# Patient Record
Sex: Female | Born: 1960 | Hispanic: Yes | Smoking: Never smoker
Health system: Southern US, Community
[De-identification: ages and names within clinical notes are randomized; demographics above are authoritative.]

## PROBLEM LIST (undated history)

## (undated) DIAGNOSIS — E785 Hyperlipidemia, unspecified: Secondary | ICD-10-CM

## (undated) DIAGNOSIS — K7581 Nonalcoholic steatohepatitis (NASH): Secondary | ICD-10-CM

## (undated) DIAGNOSIS — R001 Bradycardia, unspecified: Secondary | ICD-10-CM

## (undated) DIAGNOSIS — E039 Hypothyroidism, unspecified: Secondary | ICD-10-CM

## (undated) HISTORY — PX: CHOLECYSTECTOMY: SHX55

---

## 2013-07-08 DIAGNOSIS — K819 Cholecystitis, unspecified: Secondary | ICD-10-CM | POA: Insufficient documentation

## 2015-03-07 ENCOUNTER — Ambulatory Visit: Admit: 2015-03-07 | Disposition: A | Discharge status: Home / Self Care | Payer: Self-pay | Admitting: Internal Medicine

## 2015-05-16 ENCOUNTER — Encounter: Payer: Self-pay | Admitting: *Deleted

## 2015-05-17 ENCOUNTER — Ambulatory Visit: Payer: BLUE CROSS/BLUE SHIELD | Admitting: Anesthesiology

## 2015-05-17 ENCOUNTER — Ambulatory Visit
Admission: RE | Admit: 2015-05-17 | Discharge: 2015-05-17 | Disposition: A | Discharge status: Home / Self Care | Payer: BLUE CROSS/BLUE SHIELD | Source: Ambulatory Visit | Attending: Unknown Physician Specialty | Admitting: Unknown Physician Specialty

## 2015-05-17 ENCOUNTER — Encounter
Admission: RE | Disposition: A | Discharge status: Home / Self Care | Payer: Self-pay | Source: Ambulatory Visit | Attending: Unknown Physician Specialty

## 2015-05-17 ENCOUNTER — Encounter: Payer: Self-pay | Admitting: *Deleted

## 2015-05-17 DIAGNOSIS — E785 Hyperlipidemia, unspecified: Secondary | ICD-10-CM | POA: Insufficient documentation

## 2015-05-17 DIAGNOSIS — K621 Rectal polyp: Secondary | ICD-10-CM | POA: Insufficient documentation

## 2015-05-17 DIAGNOSIS — D123 Benign neoplasm of transverse colon: Secondary | ICD-10-CM | POA: Diagnosis not present

## 2015-05-17 DIAGNOSIS — Z1211 Encounter for screening for malignant neoplasm of colon: Secondary | ICD-10-CM | POA: Diagnosis present

## 2015-05-17 DIAGNOSIS — K648 Other hemorrhoids: Secondary | ICD-10-CM | POA: Diagnosis not present

## 2015-05-17 HISTORY — DX: Hyperlipidemia, unspecified: E78.5

## 2015-05-17 HISTORY — PX: COLONOSCOPY WITH PROPOFOL: SHX5780

## 2015-05-17 LAB — POCT PREGNANCY, URINE: Preg Test, Ur: NEGATIVE

## 2015-05-17 SURGERY — COLONOSCOPY WITH PROPOFOL
Anesthesia: General

## 2015-05-17 MED ORDER — SODIUM CHLORIDE 0.9 % IV SOLN: INTRAVENOUS | Status: DC

## 2015-05-17 MED ORDER — SODIUM CHLORIDE 0.9 % IV SOLN
INTRAVENOUS | Status: DC | PRN
  Administered 2015-05-17: 13:00:00 via INTRAVENOUS

## 2015-05-17 MED ORDER — PROPOFOL INFUSION 10 MG/ML OPTIME
INTRAVENOUS | Status: DC | PRN
  Administered 2015-05-17: 120 ug/kg/min via INTRAVENOUS

## 2015-05-17 MED ORDER — PROPOFOL 10 MG/ML IV BOLUS
INTRAVENOUS | Status: DC | PRN
  Administered 2015-05-17: 100 mg via INTRAVENOUS

## 2015-05-17 MED ORDER — EPHEDRINE SULFATE 50 MG/ML IJ SOLN
INTRAMUSCULAR | Status: DC | PRN
  Administered 2015-05-17 (×2): 10 mg via INTRAVENOUS

## 2015-05-17 NOTE — Anesthesia Postprocedure Evaluation (Signed)
  Anesthesia Post-op Note  Patient: Madison Harvey  Procedure(s) Performed: Procedure(s): COLONOSCOPY WITH PROPOFOL (N/A)  Anesthesia type:General  Patient location: PACU  Post pain: Pain level controlled  Post assessment: Post-op Vital signs reviewed, Patient's Cardiovascular Status Stable, Respiratory Function Stable, Patent Airway and No signs of Nausea or vomiting  Post vital signs: Reviewed and stable  Last Vitals:  Filed Vitals:   05/17/15 1400  BP: 110/56  Pulse: 93  Temp:   Resp: 21    Level of consciousness: awake, alert  and patient cooperative  Complications: No apparent anesthesia complications

## 2015-05-17 NOTE — Progress Notes (Signed)
Negative preg urine test.

## 2015-05-17 NOTE — Op Note (Signed)
[facility] Patient Name: Madison Harvey Procedure Date: 05/17/2015 1:25 PM MRN: [id number] Account #: [id number] Date of Birth: 1961-02-03 Admit Type: Outpatient Age: 54 Room: [facility] ENDO ROOM 1 Gender: Female Note Status: Finalized Procedure:         Colonoscopy Indications:       Screening for colorectal malignant neoplasm Providers:         Manya Silvas, MD Referring MD:      Glendon Axe (Referring MD) Medicines:         Propofol per Anesthesia Complications:     No immediate complications. Procedure:         Pre-Anesthesia Assessment:                    - After reviewing the risks and benefits, the patient was                     deemed in satisfactory condition to undergo the procedure.                    After obtaining informed consent, the colonoscope was                     passed under direct vision. Throughout the procedure, the                     patient's blood pressure, pulse, and oxygen saturations                     were monitored continuously. The Colonoscope was                     introduced through the anus and advanced to the the cecum,                     identified by appendiceal orifice and ileocecal valve. The                     colonoscopy was performed without difficulty. The patient                     tolerated the procedure well. The quality of the bowel                     preparation was good. Findings:      Two sessile polyps were found in the rectum and at the hepatic flexure.       The polyps were diminutive in size. These polyps were removed with a       jumbo cold forceps. Resection and retrieval were complete.      The exam was otherwise without abnormality. Impression:        - Two diminutive polyps in the rectum and at the hepatic                     flexure. Resected and retrieved.                    - The examination was otherwise normal. Recommendation:    - Await pathology results. Manya Silvas, MD 05/17/2015 1:55:28 PM This report has been signed electronically. Number of Addenda: 0 Note Initiated On: 05/17/2015 1:25 PM Scope Withdrawal Time: 0 hours 15 minutes 15 seconds  Total Procedure Duration: 0 hours 26 minutes 1 second  [facility]

## 2015-05-17 NOTE — Anesthesia Preprocedure Evaluation (Signed)
Anesthesia Evaluation  Patient identified by MRN, date of birth, ID band Patient awake    Reviewed: Allergy & Precautions, NPO status , Patient's Chart, lab work & pertinent test results  Airway Mallampati: I  TM Distance: >3 FB Neck ROM: Full    Dental  (+) Teeth Intact   Pulmonary    Pulmonary exam normal       Cardiovascular Exercise Tolerance: Good negative cardio ROS Normal cardiovascular exam    Neuro/Psych    GI/Hepatic   Endo/Other    Renal/GU      Musculoskeletal   Abdominal Normal abdominal exam  (+)   Peds  Hematology   Anesthesia Other Findings   Reproductive/Obstetrics                             Anesthesia Physical Anesthesia Plan  ASA: I  Anesthesia Plan: General   Post-op Pain Management:    Induction: Intravenous  Airway Management Planned: Simple Face Mask  Additional Equipment:   Intra-op Plan:   Post-operative Plan:   Informed Consent: I have reviewed the patients History and Physical, chart, labs and discussed the procedure including the risks, benefits and alternatives for the proposed anesthesia with the patient or authorized representative who has indicated his/her understanding and acceptance.     Plan Discussed with: CRNA  Anesthesia Plan Comments:         Anesthesia Quick Evaluation

## 2015-05-17 NOTE — H&P (Signed)
Primary Care Physician:  No primary care provider on file. Primary Gastroenterologist:  Dr. Vira Agar  Pre-Procedure History & Physical: HPI:  Madison Harvey is a 54 y.o. female is here for an colonoscopy.   Past Medical History  Diagnosis Date  . Hyperlipidemia     Past Surgical History  Procedure Laterality Date  . Cholecystectomy      Prior to Admission medications   Not on File    Allergies as of 04/12/2015  . (Not on File)    History reviewed. No pertinent family history.  History   Social History  . Marital Status: Divorced    Spouse Name: N/A  . Number of Children: N/A  . Years of Education: N/A   Occupational History  . Not on file.   Social History Main Topics  . Smoking status: Not on file  . Smokeless tobacco: Not on file  . Alcohol Use: Not on file  . Drug Use: Not on file  . Sexual Activity: Not on file   Other Topics Concern  . Not on file   Social History Narrative    Review of Systems: See HPI, otherwise negative ROS  Physical Exam: BP 122/64 mmHg  Pulse 62  Temp(Src) 98 F (36.7 C) (Tympanic)  Resp 20  Ht 5\' 2"  (1.575 m)  Wt 61.689 kg (136 lb)  BMI 24.87 kg/m2  SpO2 100% General:   Alert,  pleasant and cooperative in NAD Head:  Normocephalic and atraumatic. Neck:  Supple; no masses or thyromegaly. Lungs:  Clear throughout to auscultation.    Heart:  Regular rate and rhythm. Abdomen:  Soft, nontender and nondistended. Normal bowel sounds, without guarding, and without rebound.   Neurologic:  Alert and  oriented x4;  grossly normal neurologically.  Impression/Plan: Murlene Revell is here for an colonoscopy to be performed for screening.  Risks, benefits, limitations, and alternatives regarding  colonoscopy have been reviewed with the patient.  Questions have been answered.  All parties agreeable.   Gaylyn Cheers, MD  05/17/2015, 1:16 PM   Primary Care Physician:  No primary care provider on file. Primary  Gastroenterologist:  Dr. Vira Agar  Pre-Procedure History & Physical: HPI:  Madison Harvey is a 54 y.o. female is here for an colonoscopy.   Past Medical History  Diagnosis Date  . Hyperlipidemia     Past Surgical History  Procedure Laterality Date  . Cholecystectomy      Prior to Admission medications   Not on File    Allergies as of 04/12/2015  . (Not on File)    History reviewed. No pertinent family history.  History   Social History  . Marital Status: Divorced    Spouse Name: N/A  . Number of Children: N/A  . Years of Education: N/A   Occupational History  . Not on file.   Social History Main Topics  . Smoking status: Not on file  . Smokeless tobacco: Not on file  . Alcohol Use: Not on file  . Drug Use: Not on file  . Sexual Activity: Not on file   Other Topics Concern  . Not on file   Social History Narrative    Review of Systems: See HPI, otherwise negative ROS  Physical Exam: BP 122/64 mmHg  Pulse 62  Temp(Src) 98 F (36.7 C) (Tympanic)  Resp 20  Ht 5\' 2"  (1.575 m)  Wt 61.689 kg (136 lb)  BMI 24.87 kg/m2  SpO2 100% General:   Alert,  pleasant and cooperative in NAD Head:  Normocephalic and atraumatic. Neck:  Supple; no masses or thyromegaly. Lungs:  Clear throughout to auscultation.    Heart:  Regular rate and rhythm. Abdomen:  Soft, nontender and nondistended. Normal bowel sounds, without guarding, and without rebound.   Neurologic:  Alert and  oriented x4;  grossly normal neurologically.  Impression/Plan: Madison Harvey is here for an colonoscopy to be performed for screening  Risks, benefits, limitations, and alternatives regarding  colonoscopy have been reviewed with the patient.  Questions have been answered.  All parties agreeable.   Gaylyn Cheers, MD  05/17/2015, 1:16 PM

## 2015-05-17 NOTE — Transfer of Care (Signed)
Immediate Anesthesia Transfer of Care Note  Patient: Madison Harvey  Procedure(s) Performed: Procedure(s): COLONOSCOPY WITH PROPOFOL (N/A)  Patient Location: PACU  Anesthesia Type:General  Level of Consciousness: awake  Airway & Oxygen Therapy: Patient Spontanous Breathing  Post-op Assessment: Report given to RN  Post vital signs: stable  Last Vitals:  Filed Vitals:   05/17/15 1301  BP: 122/64  Pulse: 62  Temp: 36.7 C  Resp: 20    Complications: No apparent anesthesia complications

## 2015-05-20 LAB — SURGICAL PATHOLOGY

## 2015-05-27 ENCOUNTER — Encounter: Payer: Self-pay | Admitting: Unknown Physician Specialty

## 2016-02-28 ENCOUNTER — Other Ambulatory Visit: Payer: Self-pay | Admitting: Internal Medicine

## 2016-02-28 DIAGNOSIS — E7800 Pure hypercholesterolemia, unspecified: Secondary | ICD-10-CM | POA: Insufficient documentation

## 2016-02-28 DIAGNOSIS — Z1239 Encounter for other screening for malignant neoplasm of breast: Secondary | ICD-10-CM

## 2016-03-10 ENCOUNTER — Ambulatory Visit
Admission: RE | Admit: 2016-03-10 | Discharge: 2016-03-10 | Disposition: A | Discharge status: Home / Self Care | Payer: BLUE CROSS/BLUE SHIELD | Source: Ambulatory Visit | Attending: Internal Medicine | Admitting: Internal Medicine

## 2016-03-10 DIAGNOSIS — Z1231 Encounter for screening mammogram for malignant neoplasm of breast: Secondary | ICD-10-CM | POA: Diagnosis not present

## 2016-03-10 DIAGNOSIS — Z1239 Encounter for other screening for malignant neoplasm of breast: Secondary | ICD-10-CM

## 2016-11-25 ENCOUNTER — Ambulatory Visit
Admission: EM | Admit: 2016-11-25 | Discharge: 2016-11-25 | Disposition: A | Discharge status: Home / Self Care | Payer: BLUE CROSS/BLUE SHIELD | Attending: Internal Medicine | Admitting: Internal Medicine

## 2016-11-25 ENCOUNTER — Encounter: Payer: Self-pay | Admitting: Emergency Medicine

## 2016-11-25 DIAGNOSIS — K2900 Acute gastritis without bleeding: Secondary | ICD-10-CM

## 2016-11-25 DIAGNOSIS — R1013 Epigastric pain: Secondary | ICD-10-CM | POA: Diagnosis not present

## 2016-11-25 DIAGNOSIS — R740 Nonspecific elevation of levels of transaminase and lactic acid dehydrogenase [LDH]: Secondary | ICD-10-CM | POA: Diagnosis not present

## 2016-11-25 DIAGNOSIS — R7401 Elevation of levels of liver transaminase levels: Secondary | ICD-10-CM

## 2016-11-25 LAB — URINALYSIS, COMPLETE (UACMP) WITH MICROSCOPIC
Bilirubin Urine: NEGATIVE
Glucose, UA: NEGATIVE mg/dL
Ketones, ur: NEGATIVE mg/dL
Nitrite: NEGATIVE
Protein, ur: NEGATIVE mg/dL
Specific Gravity, Urine: <1.005 — ABNORMAL LOW (ref 1.005–1.030)
pH: 5.5 (ref 5.0–8.0)

## 2016-11-25 LAB — CBC WITH DIFFERENTIAL/PLATELET
Basophils Absolute: 0 10*3/uL (ref 0–0.1)
Basophils Relative: 1 %
Eosinophils Absolute: 0.1 10*3/uL (ref 0–0.7)
Eosinophils Relative: 2 %
HCT: 38.4 % (ref 35.0–47.0)
Hemoglobin: 12.9 g/dL (ref 12.0–16.0)
Lymphocytes Relative: 28 %
Lymphs Abs: 1.9 10*3/uL (ref 1.0–3.6)
MCH: 29.1 pg (ref 26.0–34.0)
MCHC: 33.6 g/dL (ref 32.0–36.0)
MCV: 86.4 fL (ref 80.0–100.0)
Monocytes Absolute: 0.8 10*3/uL (ref 0.2–0.9)
Monocytes Relative: 11 %
Neutro Abs: 4.1 10*3/uL (ref 1.4–6.5)
Neutrophils Relative %: 58 %
Platelets: 260 10*3/uL (ref 150–440)
RBC: 4.44 MIL/uL (ref 3.80–5.20)
RDW: 12.8 % (ref 11.5–14.5)
WBC: 6.9 10*3/uL (ref 3.6–11.0)

## 2016-11-25 LAB — COMPREHENSIVE METABOLIC PANEL
ALT: 96 U/L — ABNORMAL HIGH (ref 14–54)
AST: 191 U/L — ABNORMAL HIGH (ref 15–41)
Albumin: 3.7 g/dL (ref 3.5–5.0)
Alkaline Phosphatase: 133 U/L — ABNORMAL HIGH (ref 38–126)
Anion gap: 9 (ref 5–15)
BUN: 11 mg/dL (ref 6–20)
CO2: 22 mmol/L (ref 22–32)
Calcium: 8.5 mg/dL — ABNORMAL LOW (ref 8.9–10.3)
Chloride: 103 mmol/L (ref 101–111)
Creatinine, Ser: 0.49 mg/dL (ref 0.44–1.00)
GFR calc Af Amer: >60 mL/min (ref >60)
GFR calc non Af Amer: >60 mL/min (ref >60)
Glucose, Bld: 101 mg/dL — ABNORMAL HIGH (ref 65–99)
Potassium: 3.3 mmol/L — ABNORMAL LOW (ref 3.5–5.1)
Sodium: 134 mmol/L — ABNORMAL LOW (ref 135–145)
Total Bilirubin: 0.3 mg/dL (ref 0.3–1.2)
Total Protein: 7.3 g/dL (ref 6.5–8.1)

## 2016-11-25 LAB — LIPASE, BLOOD: Lipase: 33 U/L (ref 11–51)

## 2016-11-25 LAB — AMYLASE: Amylase: 64 U/L (ref 28–100)

## 2016-11-25 MED ORDER — ONDANSETRON HCL 4 MG PO TABS
8.0000 mg | ORAL_TABLET | ORAL | 0 refills | Status: DC | PRN
Start: 2016-11-25 — End: 2018-12-21

## 2016-11-25 MED ORDER — OMEPRAZOLE 40 MG PO CPDR: 40.0000 mg | DELAYED_RELEASE_CAPSULE | Freq: Two times a day (BID) | ORAL | 0 refills | Status: DC

## 2016-11-25 NOTE — ED Triage Notes (Signed)
Patient stated she has been having abdominal pains, vomiting, no appetite and dizziness for 1 week

## 2016-11-25 NOTE — Discharge Instructions (Addendum)
Prescription for omeprazole (for stomach acid) and ondansetron (for nausea/vomiting) sent to the pharmacy.  Followup with primary care provider to discuss further evaluation/management of elevated liver tests.  Avoid alcohol as it will aggravate the elevated liver tests and also the stomach discomfort.

## 2016-11-25 NOTE — ED Provider Notes (Signed)
[facility]    CSN: [id number] Arrival date & time: 11/25/16  1518     History   Chief Complaint Chief Complaint  Patient presents with  . Abdominal Pain    HPI Madison Harvey is a 55 y.o. female. She presents today with history of vomiting multiple times on Monday and Tuesday this week, this has subsided but she doesn't have any appetite yet. She had some diarrhea yesterday. No fever, no cough. No sore throat. She does have residual epigastric discomfort and bloating. She relates that the epigastric discomfort and bloating actually have been going on for several days, started last week. She has question of past history of lupus. She is worried about a liver disorder. She has had her gallbladder out in the last several years.  She has a rash on her hands intermittently for the last 7 years, itchy.   HPI  Past Medical History:  Diagnosis Date  . Hyperlipidemia      Past Surgical History:  Procedure Laterality Date  . CHOLECYSTECTOMY    . COLONOSCOPY WITH PROPOFOL N/A 05/17/2015   Procedure: COLONOSCOPY WITH PROPOFOL;  Surgeon: Manya Silvas, MD;  Location: [facility] ENDOSCOPY;  Service: Endoscopy;  Laterality: N/A;     Home Medications   Takes no meds regularly  Family History Family History  Problem Relation Age of Onset  . Breast cancer Neg Hx     Social History Social History  Substance Use Topics  . Smoking status: Never Smoker  . Smokeless tobacco: Never Used  . Alcohol use No     Allergies   Patient has no known allergies.   Review of Systems Review of Systems  All other systems reviewed and are negative.    Physical Exam Triage Vital Signs ED Triage Vitals  Enc Vitals Group     BP 11/25/16 1617 108/63     Pulse Rate 11/25/16 1617 62     Resp 11/25/16 1617 16     Temp 11/25/16 1617 97.4 F (36.3 C)     Temp src --      SpO2 11/25/16 1617 99 %     Weight 11/25/16 1619 135 lb (61.2 kg)     Height 11/25/16 1619 5\' 1"  (1.549 m)       Pain Score 11/25/16 1621 2   No data found.   Updated Vital Signs BP 108/63 (BP Location: Left Arm)   Pulse 62   Temp 97.4 F (36.3 C)   Resp 16   Ht 5\' 1"  (1.549 m)   Wt 135 lb (61.2 kg)   SpO2 99%   BMI 25.51 kg/m  Physical Exam  Constitutional: She is oriented to person, place, and time. No distress.  Alert, nicely groomed  HENT:  Head: Atraumatic.  Eyes:  Conjugate gaze, no eye redness/drainage  Neck: Neck supple.  Cardiovascular: Normal rate and regular rhythm.   Pulmonary/Chest: No respiratory distress. She has no wheezes. She has no rales.  Lungs clear, symmetric breath sounds  Abdominal: Soft. She exhibits no distension. There is no tenderness. There is no rebound and no guarding.  Musculoskeletal: Normal range of motion.  No leg swelling  Neurological: She is alert and oriented to person, place, and time.  Skin: Skin is warm and dry.  No cyanosis Little patch of excoriated red papules over the base of the right thumb dorsally.  Nursing note and vitals reviewed.    UC Treatments / Results  Labs Results for orders placed or performed during the  hospital encounter of 11/25/16  CBC with Differential  Result Value Ref Range   WBC 6.9 3.6 - 11.0 K/uL   RBC 4.44 3.80 - 5.20 MIL/uL   Hemoglobin 12.9 12.0 - 16.0 g/dL   HCT 38.4 35.0 - 47.0 %   MCV 86.4 80.0 - 100.0 fL   MCH 29.1 26.0 - 34.0 pg   MCHC 33.6 32.0 - 36.0 g/dL   RDW 12.8 11.5 - 14.5 %   Platelets 260 150 - 440 K/uL   Neutrophils Relative % 58 %   Neutro Abs 4.1 1.4 - 6.5 K/uL   Lymphocytes Relative 28 %   Lymphs Abs 1.9 1.0 - 3.6 K/uL   Monocytes Relative 11 %   Monocytes Absolute 0.8 0.2 - 0.9 K/uL   Eosinophils Relative 2 %   Eosinophils Absolute 0.1 0 - 0.7 K/uL   Basophils Relative 1 %   Basophils Absolute 0.0 0 - 0.1 K/uL  Lipase, blood  Result Value Ref Range   Lipase 33 11 - 51 U/L  Amylase  Result Value Ref Range   Amylase 64 28 - 100 U/L  Comprehensive metabolic panel   Result Value Ref Range   Sodium 134 (L) 135 - 145 mmol/L   Potassium 3.3 (L) 3.5 - 5.1 mmol/L   Chloride 103 101 - 111 mmol/L   CO2 22 22 - 32 mmol/L   Glucose, Bld 101 (H) 65 - 99 mg/dL   BUN 11 6 - 20 mg/dL   Creatinine, Ser 0.49 0.44 - 1.00 mg/dL   Calcium 8.5 (L) 8.9 - 10.3 mg/dL   Total Protein 7.3 6.5 - 8.1 g/dL   Albumin 3.7 3.5 - 5.0 g/dL   AST 191 (H) 15 - 41 U/L   ALT 96 (H) 14 - 54 U/L   Alkaline Phosphatase 133 (H) 38 - 126 U/L   Total Bilirubin 0.3 0.3 - 1.2 mg/dL   GFR calc non Af Amer >60 >60 mL/min   GFR calc Af Amer >60 >60 mL/min   Anion gap 9 5 - 15  Urinalysis, Complete w Microscopic  Result Value Ref Range   Color, Urine YELLOW YELLOW   APPearance CLEAR CLEAR   Specific Gravity, Urine <1.005 (L) 1.005 - 1.030   pH 5.5 5.0 - 8.0   Glucose, UA NEGATIVE NEGATIVE mg/dL   Hgb urine dipstick SMALL (A) NEGATIVE   Bilirubin Urine NEGATIVE NEGATIVE   Ketones, ur NEGATIVE NEGATIVE mg/dL   Protein, ur NEGATIVE NEGATIVE mg/dL   Nitrite NEGATIVE NEGATIVE   Leukocytes, UA TRACE (A) NEGATIVE   Squamous Epithelial / LPF 0-5 (A) NONE SEEN   WBC, UA 0-5 0 - 5 WBC/hpf   RBC / HPF 0-5 0 - 5 RBC/hpf   Bacteria, UA RARE (A) NONE SEEN    Procedures Procedures (including critical care time) None today  Final Clinical Impressions(s) / UC Diagnoses   Final diagnoses:  Acute epigastric pain  Transaminitis  Acute gastritis without hemorrhage, unspecified gastritis type   Prescription for omeprazole (for stomach acid) and ondansetron (for nausea/vomiting) sent to the pharmacy.  Followup with primary care provider to discuss further evaluation/management of elevated liver tests.  Avoid alcohol as it will aggravate the elevated liver tests and also the stomach discomfort.    New Prescriptions Discharge Medication List as of 11/25/2016  5:57 PM    START taking these medications   Details  omeprazole (PRILOSEC) 40 MG capsule Take 1 capsule (40 mg total) by mouth 2 (two)  times daily  before a meal., Starting Wed 11/25/2016, Until Thu 12/10/2016, Normal    ondansetron (ZOFRAN) 4 MG tablet Take 2 tablets (8 mg total) by mouth every 4 (four) hours as needed for nausea or vomiting., Starting Wed 11/25/2016, Normal         Sherlene Shams, MD 11/28/16 1103

## 2017-04-28 DIAGNOSIS — K2900 Acute gastritis without bleeding: Secondary | ICD-10-CM | POA: Insufficient documentation

## 2017-05-03 ENCOUNTER — Other Ambulatory Visit: Payer: Self-pay | Admitting: Internal Medicine

## 2017-05-03 DIAGNOSIS — Z1231 Encounter for screening mammogram for malignant neoplasm of breast: Secondary | ICD-10-CM

## 2017-05-26 ENCOUNTER — Ambulatory Visit
Admission: RE | Admit: 2017-05-26 | Discharge: 2017-05-26 | Disposition: A | Discharge status: Home / Self Care | Payer: BLUE CROSS/BLUE SHIELD | Source: Ambulatory Visit | Attending: Internal Medicine | Admitting: Internal Medicine

## 2017-05-26 DIAGNOSIS — Z1231 Encounter for screening mammogram for malignant neoplasm of breast: Secondary | ICD-10-CM

## 2017-06-10 ENCOUNTER — Other Ambulatory Visit: Payer: Self-pay | Admitting: Internal Medicine

## 2017-06-10 DIAGNOSIS — R748 Abnormal levels of other serum enzymes: Secondary | ICD-10-CM | POA: Insufficient documentation

## 2017-06-10 DIAGNOSIS — N281 Cyst of kidney, acquired: Secondary | ICD-10-CM | POA: Insufficient documentation

## 2017-06-17 ENCOUNTER — Ambulatory Visit
Admission: RE | Admit: 2017-06-17 | Discharge: 2017-06-17 | Disposition: A | Discharge status: Home / Self Care | Payer: BLUE CROSS/BLUE SHIELD | Source: Ambulatory Visit | Attending: Internal Medicine | Admitting: Internal Medicine

## 2017-06-17 DIAGNOSIS — N281 Cyst of kidney, acquired: Secondary | ICD-10-CM | POA: Diagnosis present

## 2017-06-17 DIAGNOSIS — K769 Liver disease, unspecified: Secondary | ICD-10-CM | POA: Diagnosis not present

## 2017-06-17 DIAGNOSIS — R748 Abnormal levels of other serum enzymes: Secondary | ICD-10-CM | POA: Diagnosis present

## 2017-06-17 DIAGNOSIS — Z9049 Acquired absence of other specified parts of digestive tract: Secondary | ICD-10-CM | POA: Diagnosis not present

## 2017-09-13 DIAGNOSIS — K7581 Nonalcoholic steatohepatitis (NASH): Secondary | ICD-10-CM | POA: Insufficient documentation

## 2017-09-13 DIAGNOSIS — R001 Bradycardia, unspecified: Secondary | ICD-10-CM | POA: Insufficient documentation

## 2017-09-13 DIAGNOSIS — R42 Dizziness and giddiness: Secondary | ICD-10-CM | POA: Insufficient documentation

## 2018-04-26 ENCOUNTER — Other Ambulatory Visit: Payer: Self-pay | Admitting: Internal Medicine

## 2018-04-26 DIAGNOSIS — Z1231 Encounter for screening mammogram for malignant neoplasm of breast: Secondary | ICD-10-CM

## 2018-06-09 ENCOUNTER — Ambulatory Visit
Admission: RE | Admit: 2018-06-09 | Discharge: 2018-06-09 | Disposition: A | Discharge status: Home / Self Care | Payer: BLUE CROSS/BLUE SHIELD | Source: Ambulatory Visit | Attending: Internal Medicine | Admitting: Internal Medicine

## 2018-06-09 DIAGNOSIS — Z1231 Encounter for screening mammogram for malignant neoplasm of breast: Secondary | ICD-10-CM | POA: Insufficient documentation

## 2018-11-14 ENCOUNTER — Other Ambulatory Visit: Payer: Self-pay | Admitting: Internal Medicine

## 2018-11-14 DIAGNOSIS — R748 Abnormal levels of other serum enzymes: Secondary | ICD-10-CM

## 2018-11-17 ENCOUNTER — Ambulatory Visit: Payer: BLUE CROSS/BLUE SHIELD | Attending: Internal Medicine

## 2018-12-16 ENCOUNTER — Ambulatory Visit
Admission: RE | Admit: 2018-12-16 | Discharge: 2018-12-16 | Disposition: A | Discharge status: Home / Self Care | Payer: BLUE CROSS/BLUE SHIELD | Source: Ambulatory Visit | Attending: Internal Medicine | Admitting: Internal Medicine

## 2018-12-16 DIAGNOSIS — R748 Abnormal levels of other serum enzymes: Secondary | ICD-10-CM | POA: Diagnosis not present

## 2018-12-21 ENCOUNTER — Encounter: Payer: Self-pay | Admitting: Emergency Medicine

## 2018-12-21 ENCOUNTER — Inpatient Hospital Stay
Admission: EM | Admit: 2018-12-21 | Discharge: 2018-12-24 | DRG: 440 | Disposition: A | Discharge status: Home / Self Care | Payer: BLUE CROSS/BLUE SHIELD | Attending: Internal Medicine | Admitting: Internal Medicine

## 2018-12-21 ENCOUNTER — Emergency Department: Payer: BLUE CROSS/BLUE SHIELD

## 2018-12-21 DIAGNOSIS — R52 Pain, unspecified: Secondary | ICD-10-CM

## 2018-12-21 DIAGNOSIS — Z8619 Personal history of other infectious and parasitic diseases: Secondary | ICD-10-CM

## 2018-12-21 DIAGNOSIS — R740 Nonspecific elevation of levels of transaminase and lactic acid dehydrogenase [LDH]: Secondary | ICD-10-CM | POA: Diagnosis present

## 2018-12-21 DIAGNOSIS — Z8349 Family history of other endocrine, nutritional and metabolic diseases: Secondary | ICD-10-CM | POA: Diagnosis not present

## 2018-12-21 DIAGNOSIS — D649 Anemia, unspecified: Secondary | ICD-10-CM | POA: Diagnosis present

## 2018-12-21 DIAGNOSIS — Z8261 Family history of arthritis: Secondary | ICD-10-CM | POA: Diagnosis not present

## 2018-12-21 DIAGNOSIS — R109 Unspecified abdominal pain: Secondary | ICD-10-CM | POA: Diagnosis present

## 2018-12-21 DIAGNOSIS — E860 Dehydration: Secondary | ICD-10-CM | POA: Diagnosis present

## 2018-12-21 DIAGNOSIS — K760 Fatty (change of) liver, not elsewhere classified: Secondary | ICD-10-CM | POA: Diagnosis present

## 2018-12-21 DIAGNOSIS — Z9049 Acquired absence of other specified parts of digestive tract: Secondary | ICD-10-CM

## 2018-12-21 DIAGNOSIS — K859 Acute pancreatitis without necrosis or infection, unspecified: Secondary | ICD-10-CM | POA: Diagnosis not present

## 2018-12-21 DIAGNOSIS — M79652 Pain in left thigh: Secondary | ICD-10-CM | POA: Diagnosis present

## 2018-12-21 DIAGNOSIS — E785 Hyperlipidemia, unspecified: Secondary | ICD-10-CM | POA: Diagnosis present

## 2018-12-21 DIAGNOSIS — R7401 Elevation of levels of liver transaminase levels: Secondary | ICD-10-CM | POA: Diagnosis present

## 2018-12-21 DIAGNOSIS — R1013 Epigastric pain: Secondary | ICD-10-CM

## 2018-12-21 DIAGNOSIS — R748 Abnormal levels of other serum enzymes: Secondary | ICD-10-CM | POA: Diagnosis not present

## 2018-12-21 LAB — COMPREHENSIVE METABOLIC PANEL
ALBUMIN: 4.1 g/dL (ref 3.5–5.0)
ALT: 1553 U/L — ABNORMAL HIGH (ref 0–44)
AST: 2347 U/L — AB (ref 15–41)
Alkaline Phosphatase: 337 U/L — ABNORMAL HIGH (ref 38–126)
Anion gap: 13 (ref 5–15)
BILIRUBIN TOTAL: 2.5 mg/dL — AB (ref 0.3–1.2)
BUN: 13 mg/dL (ref 6–20)
CALCIUM: 9.1 mg/dL (ref 8.9–10.3)
CO2: 25 mmol/L (ref 22–32)
CREATININE: 0.57 mg/dL (ref 0.44–1.00)
Chloride: 101 mmol/L (ref 98–111)
GFR calc Af Amer: >60 mL/min (ref >60)
GFR calc non Af Amer: >60 mL/min (ref >60)
Glucose, Bld: 171 mg/dL — ABNORMAL HIGH (ref 70–99)
Potassium: 3.6 mmol/L (ref 3.5–5.1)
Sodium: 139 mmol/L (ref 135–145)
TOTAL PROTEIN: 7.2 g/dL (ref 6.5–8.1)

## 2018-12-21 LAB — URINALYSIS, COMPLETE (UACMP) WITH MICROSCOPIC
BILIRUBIN URINE: NEGATIVE
Bacteria, UA: NONE SEEN
Glucose, UA: NEGATIVE mg/dL
HGB URINE DIPSTICK: NEGATIVE
Ketones, ur: 5 mg/dL — AB
LEUKOCYTES UA: NEGATIVE
NITRITE: NEGATIVE
PH: 8 (ref 5.0–8.0)
Protein, ur: 100 mg/dL — AB
SPECIFIC GRAVITY, URINE: 1.024 (ref 1.005–1.030)

## 2018-12-21 LAB — CBC
HEMATOCRIT: 37.8 % (ref 36.0–46.0)
Hemoglobin: 12.4 g/dL (ref 12.0–15.0)
MCH: 28.9 pg (ref 26.0–34.0)
MCHC: 32.8 g/dL (ref 30.0–36.0)
MCV: 88.1 fL (ref 80.0–100.0)
NRBC: 0.6 % — AB (ref 0.0–0.2)
PLATELETS: 245 10*3/uL (ref 150–400)
RBC: 4.29 MIL/uL (ref 3.87–5.11)
RDW: 13.2 % (ref 11.5–15.5)
WBC: 12.7 10*3/uL — AB (ref 4.0–10.5)

## 2018-12-21 LAB — TRIGLYCERIDES: Triglycerides: 150 mg/dL — ABNORMAL HIGH (ref <150)

## 2018-12-21 LAB — PROTIME-INR
INR: 1.03
PROTHROMBIN TIME: 13.4 s (ref 11.4–15.2)

## 2018-12-21 LAB — PREGNANCY, URINE: Preg Test, Ur: NEGATIVE

## 2018-12-21 LAB — ACETAMINOPHEN LEVEL: Acetaminophen (Tylenol), Serum: <10 ug/mL — ABNORMAL LOW (ref 10–30)

## 2018-12-21 LAB — LIPASE, BLOOD: Lipase: >10000 U/L — ABNORMAL HIGH (ref 11–51)

## 2018-12-21 MED ORDER — SODIUM CHLORIDE 0.9 % IV SOLN
INTRAVENOUS | Status: AC
  Administered 2018-12-22 (×2): via INTRAVENOUS

## 2018-12-21 MED ORDER — MORPHINE SULFATE (PF) 4 MG/ML IV SOLN
4.0000 mg | INTRAVENOUS | Status: DC | PRN
  Administered 2018-12-21: 4 mg via INTRAVENOUS
  Filled 2018-12-21: qty 1

## 2018-12-21 MED ORDER — SODIUM CHLORIDE 0.9% FLUSH
3.0000 mL | Freq: Once | INTRAVENOUS | Status: AC
  Administered 2018-12-21: 3 mL via INTRAVENOUS

## 2018-12-21 MED ORDER — SODIUM CHLORIDE 0.9 % IV BOLUS
500.0000 mL | Freq: Once | INTRAVENOUS | Status: AC
  Administered 2018-12-21: 500 mL via INTRAVENOUS

## 2018-12-21 MED ORDER — ONDANSETRON HCL 4 MG PO TABS: 4.0000 mg | ORAL_TABLET | Freq: Four times a day (QID) | ORAL | Status: DC | PRN

## 2018-12-21 MED ORDER — ONDANSETRON HCL 4 MG/2ML IJ SOLN
4.0000 mg | Freq: Once | INTRAMUSCULAR | Status: AC
  Administered 2018-12-21: 4 mg via INTRAVENOUS
  Filled 2018-12-21: qty 2

## 2018-12-21 MED ORDER — ENOXAPARIN SODIUM 40 MG/0.4ML ~~LOC~~ SOLN
40.0000 mg | SUBCUTANEOUS | Status: DC
  Administered 2018-12-22 – 2018-12-23 (×2): 40 mg via SUBCUTANEOUS
  Filled 2018-12-21 (×2): qty 0.4

## 2018-12-21 MED ORDER — ONDANSETRON 4 MG PO TBDP
4.0000 mg | ORAL_TABLET | Freq: Once | ORAL | Status: DC | PRN
  Filled 2018-12-21: qty 1

## 2018-12-21 MED ORDER — OXYCODONE HCL 5 MG PO TABS: 5.0000 mg | ORAL_TABLET | ORAL | Status: DC | PRN

## 2018-12-21 MED ORDER — MORPHINE SULFATE (PF) 4 MG/ML IV SOLN: 4.0000 mg | INTRAVENOUS | Status: DC | PRN

## 2018-12-21 MED ORDER — ONDANSETRON HCL 4 MG/2ML IJ SOLN: 4.0000 mg | Freq: Four times a day (QID) | INTRAMUSCULAR | Status: DC | PRN

## 2018-12-21 MED ORDER — IBUPROFEN 400 MG PO TABS: 400.0000 mg | ORAL_TABLET | Freq: Four times a day (QID) | ORAL | Status: DC | PRN

## 2018-12-21 MED ORDER — IOHEXOL 300 MG/ML  SOLN
100.0000 mL | Freq: Once | INTRAMUSCULAR | Status: AC | PRN
  Administered 2018-12-21: 100 mL via INTRAVENOUS
  Filled 2018-12-21: qty 100

## 2018-12-21 MED ORDER — SODIUM CHLORIDE 0.9 % IV SOLN
Freq: Once | INTRAVENOUS | Status: AC
  Administered 2018-12-21: 21:00:00 via INTRAVENOUS

## 2018-12-21 NOTE — ED Triage Notes (Signed)
Through spanish interpreter:  Patient presents to the ED with upper abdominal pain that began around 1pm after eating her lunch.  Patient reports history of similar pain and states she is seeing her PCP for the pain and had an US done last week but has not yet been notified of results.  Patient appears uncomfortable in triage.  Denies vomiting and diarrhea.  States usually she vomits with the pain and feels better.

## 2018-12-21 NOTE — ED Notes (Signed)
ED TO INPATIENT HANDOFF REPORT  Name/Age/Gender Madison Harvey 58 y.o. female  Code Status   Home/SNF/Other home  Chief Complaint abd pain ems  Level of Care/Admitting Diagnosis ED Disposition    ED Disposition Condition [facility]: [place] [100120]  Level of Care: Med-Surg [16]  Diagnosis: Acute pancreatitis [577.0.ICD-9-CM]  Admitting Physician: Lance Coon [[id number]]  Attending Physician: Lance Coon [phone]  Estimated length of stay: past midnight tomorrow  Certification:: I certify this patient will need inpatient services for at least 2 midnights  PT Class (Do Not Modify): Inpatient [101]  PT Acc Code (Do Not Modify): Private [1]       Medical History Past Medical History:  Diagnosis Date  . Hyperlipidemia     Allergies No Known Allergies  IV Location/Drains/Wounds Patient Lines/Drains/Airways Status   Active Line/Drains/Airways    Name:   Placement date:   Placement time:   Site:   Days:   Peripheral IV 12/21/18 Left Antecubital   12/21/18    1850    Antecubital   less than 1   Peripheral IV 12/21/18 Right Hand   12/21/18    2147    Hand   less than 1          Labs/Imaging Results for orders placed or performed during the hospital encounter of 12/21/18 (from the past 48 hour(s))  Lipase, blood     Status: Abnormal   Collection Time: 12/21/18  6:21 PM  Result Value Ref Range   Lipase >10,000 (H) 11 - 51 U/L    Comment: RESULT CONFIRMED BY MANUAL DILUTION AKT Performed at [facility], [address] [place], [place] [address]   Comprehensive metabolic panel     Status: Abnormal   Collection Time: 12/21/18  6:21 PM  Result Value Ref Range   Sodium 139 135 - 145 mmol/L   Potassium 3.6 3.5 - 5.1 mmol/L   Chloride 101 98 - 111 mmol/L   CO2 25 22 - 32 mmol/L   Glucose, Bld 171 (H) 70 - 99 mg/dL   BUN 13 6 - 20 mg/dL   Creatinine, Ser 0.57 0.44 - 1.00 mg/dL   Calcium 9.1 8.9 - 10.3 mg/dL    Total Protein 7.2 6.5 - 8.1 g/dL   Albumin 4.1 3.5 - 5.0 g/dL   AST 2,347 (H) 15 - 41 U/L    Comment: RESULT CONFIRMED BY MANUAL DILUTION KLW   ALT 1,553 (H) 0 - 44 U/L   Alkaline Phosphatase 337 (H) 38 - 126 U/L   Total Bilirubin 2.5 (H) 0.3 - 1.2 mg/dL   GFR calc non Af Amer >60 >60 mL/min   GFR calc Af Amer >60 >60 mL/min   Anion gap 13 5 - 15    Comment: Performed at [facility], [address] [place], [place] [address]  CBC     Status: Abnormal   Collection Time: 12/21/18  6:21 PM  Result Value Ref Range   WBC 12.7 (H) 4.0 - 10.5 K/uL   RBC 4.29 3.87 - 5.11 MIL/uL   Hemoglobin 12.4 12.0 - 15.0 g/dL   HCT 37.8 36.0 - 46.0 %   MCV 88.1 80.0 - 100.0 fL   MCH 28.9 26.0 - 34.0 pg   MCHC 32.8 30.0 - 36.0 g/dL   RDW 13.2 11.5 - 15.5 %   Platelets 245 150 - 400 K/uL   nRBC 0.6 (H) 0.0 - 0.2 %    Comment: Performed  at [facility], [address] [place], [place] [address]  Urinalysis, Complete w Microscopic     Status: Abnormal   Collection Time: 12/21/18  6:21 PM  Result Value Ref Range   Color, Urine AMBER (A) YELLOW    Comment: BIOCHEMICALS MAY BE AFFECTED BY COLOR   APPearance CLEAR (A) CLEAR   Specific Gravity, Urine 1.024 1.005 - 1.030   pH 8.0 5.0 - 8.0   Glucose, UA NEGATIVE NEGATIVE mg/dL   Hgb urine dipstick NEGATIVE NEGATIVE   Bilirubin Urine NEGATIVE NEGATIVE   Ketones, ur 5 (A) NEGATIVE mg/dL   Protein, ur 100 (A) NEGATIVE mg/dL   Nitrite NEGATIVE NEGATIVE   Leukocytes, UA NEGATIVE NEGATIVE   RBC / HPF 11-20 0 - 5 RBC/hpf   WBC, UA 0-5 0 - 5 WBC/hpf   Bacteria, UA NONE SEEN NONE SEEN   Squamous Epithelial / LPF 0-5 0 - 5   Mucus PRESENT     Comment: Performed at [facility], [address], [place], [place] [address]  Pregnancy, urine     Status: None   Collection Time: 12/21/18  6:21 PM  Result Value Ref Range   Preg Test, Ur NEGATIVE NEGATIVE    Comment: Performed at [facility], [address],  [place], [place] [address]  Triglycerides     Status: Abnormal   Collection Time: 12/21/18  6:21 PM  Result Value Ref Range   Triglycerides 150 (H) <150 mg/dL    Comment: Performed at [facility], [address] [place], [place] [address]  Acetaminophen level     Status: Abnormal   Collection Time: 12/21/18  8:13 PM  Result Value Ref Range   Acetaminophen (Tylenol), Serum <10 (L) 10 - 30 ug/mL    Comment: (NOTE) Therapeutic concentrations vary significantly. A range of 10-30 ug/mL  may be an effective concentration for many patients. However, some  are best treated at concentrations outside of this range. Acetaminophen concentrations >150 ug/mL at 4 hours after ingestion  and >50 ug/mL at 12 hours after ingestion are often associated with  toxic reactions. Performed at [facility], [address] [place], [place] [address]   Protime-INR     Status: None   Collection Time: 12/21/18  9:53 PM  Result Value Ref Range   Prothrombin Time 13.4 11.4 - 15.2 seconds   INR 1.03     Comment: Performed at [facility], [address] [place], [place] [address]   Ct Abdomen Pelvis W Contrast  Result Date: 12/21/2018 CLINICAL DATA:  58 year old female with upper abdominal pain. EXAM: CT ABDOMEN AND PELVIS WITH CONTRAST TECHNIQUE: Multidetector CT imaging of the abdomen and pelvis was performed using the standard protocol following bolus administration of intravenous contrast. CONTRAST:  176mL OMNIPAQUE IOHEXOL 300 MG/ML  SOLN COMPARISON:  Abdominal ultrasound dated 12/16/2018 FINDINGS: Lower chest: Minimal bibasilar dependent atelectatic changes. No intra-abdominal free air. No significant free fluid. Hepatobiliary: The liver is unremarkable. No intrahepatic biliary ductal dilatation. Cholecystectomy. Pancreas: There is apparent minimal haziness of the pancreas concerning for early acute pancreatitis. Correlation with pancreatic enzymes recommended. No fluid collection or  abscess. Spleen: Normal in size without focal abnormality. Adrenals/Urinary Tract: The adrenal glands are unremarkable. There is no hydronephrosis on either side. There is symmetric enhancement and excretion of contrast by both kidneys. The visualized ureters and urinary bladder appear unremarkable. Stomach/Bowel: Minimal diffuse thickened appearance of the colon, likely related to underdistention. Mild colitis is less likely. Clinical correlation  is recommended. There are several colonic diverticula in the ascending colon and adjacent to the ileocecal bowel without active inflammatory changes. Normal caliber fecalized loops of distal small bowel likely represent increased transit time or small intestine bacterial overgrowth. Clinical correlation is recommended. There is no bowel obstruction. The appendix is normal. Vascular/Lymphatic: The abdominal aorta and IVC appear unremarkable. No portal venous gas. There is no adenopathy. Reproductive: The uterus and ovaries are grossly unremarkable. No pelvic mass. Other: None Musculoskeletal: No acute or significant osseous findings. IMPRESSION: 1. Findings likely represent an early/mild acute pancreatitis. Correlation with pancreatic enzymes recommended. 2. Underdistention of the colon versus less likely mild colitis. Clinical correlation is recommended. No bowel obstruction. Normal appendix. Electronically Signed   By: Anner Crete M.D.   On: 12/21/2018 20:32    Pending Labs [employer] (From admission, onward)    Start     Ordered   Signed and Held  HIV antibody (Routine Testing)  Once,   R     Signed and Held   Signed and Held  CBC  (enoxaparin (LOVENOX)    CrCl >/= 30 ml/min)  Once,   R    Comments:  Baseline for enoxaparin therapy IF NOT ALREADY DRAWN.  Notify MD if PLT < 100 K.    Signed and Held   Signed and Held  Creatinine, serum  (enoxaparin (LOVENOX)    CrCl >/= 30 ml/min)  Once,   R    Comments:  Baseline for enoxaparin therapy IF NOT  ALREADY DRAWN.    Signed and Held   Signed and Held  Creatinine, serum  (enoxaparin (LOVENOX)    CrCl >/= 30 ml/min)  Weekly,   R    Comments:  while on enoxaparin therapy    Signed and Held   Signed and Held  Comprehensive metabolic panel  Tomorrow morning,   R     Signed and Held   Signed and Held  CBC  Tomorrow morning,   R     Signed and Held   Signed and Held  Lipase, blood  Tomorrow morning,   R     Signed and Held          Vitals/Pain Today's Vitals   12/21/18 1813 12/21/18 1817 12/21/18 1959 12/21/18 2239  BP:   118/63 102/68  Pulse:   71 78  Resp:   17 17  Temp:    98 F (36.7 C)  TempSrc:    Oral  SpO2:   98% 98%  Weight: 61.7 kg     Height: 5\' 1"  (1.549 m)     PainSc:  10-Worst pain ever  6     Isolation Precautions No active isolations  Medications Medications  ondansetron (ZOFRAN-ODT) disintegrating tablet 4 mg (has no administration in time range)  morphine 4 MG/ML injection 4 mg (4 mg Intravenous Given 12/21/18 1852)  sodium chloride flush (NS) 0.9 % injection 3 mL (3 mLs Intravenous Given 12/21/18 1851)  sodium chloride 0.9 % bolus 500 mL (0 mLs Intravenous Stopped 12/21/18 2012)  ondansetron (ZOFRAN) injection 4 mg (4 mg Intravenous Given 12/21/18 1852)  iohexol (OMNIPAQUE) 300 MG/ML solution 100 mL (100 mLs Intravenous Contrast Given 12/21/18 2010)  sodium chloride 0.9 % bolus 500 mL (0 mLs Intravenous Stopped 12/21/18 2148)  0.9 %  sodium chloride infusion ( Intravenous New Bag/Given 12/21/18 2127)    Mobility independent

## 2018-12-21 NOTE — H&P (Signed)
[facility] [place] NAME: Madison Harvey    MR#:  [id number]  DATE OF BIRTH:  [birth year]  DATE OF ADMISSION:  12/21/2018  PRIMARY CARE PHYSICIAN: Glendon Axe, MD   REQUESTING/REFERRING PHYSICIAN: Quentin Cornwall, MD  CHIEF COMPLAINT:   Chief Complaint  Patient presents with  . Abdominal Pain    HISTORY OF PRESENT ILLNESS:  Madison Harvey  is a 58 y.o. female who presents with chief complaint as above.  Patient states that she developed severe right-sided abdominal pain today after eating lunch.  She states that she has been having this pain off and on for a recent period of time.  She was seen by her primary care physician within this past week for similar complaint, and had a right upper quadrant ultrasound performed due to elevated transaminases on blood work.  That ultrasound was largely unremarkable except for evidence of fatty liver.  Patient presents tonight with significant pain, and has a clear case of pancreatitis here tonight, also with elevated transaminases.  Her lipase was greater than 10,000, and her transaminases were also up into the 1500-2000 range.  CT abdomen is largely unrevealing.  Patient has had a cholecystectomy, and imaging does not show any evidence of passage of stones.  Hospitalist were called for admission and further evaluation and treatment  PAST MEDICAL HISTORY:   Past Medical History:  Diagnosis Date  . Hyperlipidemia      PAST SURGICAL HISTORY:   Past Surgical History:  Procedure Laterality Date  . CHOLECYSTECTOMY    . COLONOSCOPY WITH PROPOFOL N/A 05/17/2015   Procedure: COLONOSCOPY WITH PROPOFOL;  Surgeon: Manya Silvas, MD;  Location: [facility] ENDOSCOPY;  Service: Endoscopy;  Laterality: N/A;     SOCIAL HISTORY:   Social History   Tobacco Use  . Smoking status: Never Smoker  . Smokeless tobacco: Never Used  Substance Use Topics  . Alcohol use: No     FAMILY HISTORY:   Family History   Problem Relation Age of Onset  . Arthritis Mother   . Thyroid disease Mother   . Thyroid disease Brother   . Breast cancer Neg Hx      DRUG ALLERGIES:  No Known Allergies  MEDICATIONS AT HOME:   Prior to Admission medications   Not on File    REVIEW OF SYSTEMS:  Review of Systems  Constitutional: Negative for chills, fever, malaise/fatigue and weight loss.  HENT: Negative for ear pain, hearing loss and tinnitus.   Eyes: Negative for blurred vision, double vision, pain and redness.  Respiratory: Negative for cough, hemoptysis and shortness of breath.   Cardiovascular: Negative for chest pain, palpitations, orthopnea and leg swelling.  Gastrointestinal: Positive for abdominal pain, nausea and vomiting. Negative for constipation and diarrhea.  Genitourinary: Negative for dysuria, frequency and hematuria.  Musculoskeletal: Negative for back pain, joint pain and neck pain.  Skin:       No acne, rash, or lesions  Neurological: Negative for dizziness, tremors, focal weakness and weakness.  Endo/Heme/Allergies: Negative for polydipsia. Does not bruise/bleed easily.  Psychiatric/Behavioral: Negative for depression. The patient is not nervous/anxious and does not have insomnia.      VITAL SIGNS:   Vitals:   12/21/18 1804 12/21/18 1813 12/21/18 1959 12/21/18 2239  BP: 90/60  118/63 102/68  Pulse: 65  71 78  Resp: 20  17 17   Temp: (!) 97.5 F (36.4 C)   98 F (36.7 C)  TempSrc: Oral   Oral  SpO2: 100%  98% 98%  Weight:  61.7 kg    Height:  5\' 1"  (1.549 m)     Wt Readings from Last 3 Encounters:  12/21/18 61.7 kg  11/25/16 61.2 kg  05/17/15 61.7 kg    PHYSICAL EXAMINATION:  Physical Exam  Vitals reviewed. Constitutional: She is oriented to person, place, and time. She appears well-developed and well-nourished. No distress.  HENT:  Head: Normocephalic and atraumatic.  Mouth/Throat: Oropharynx is clear and moist.  Eyes: Pupils are equal, round, and reactive to light.  Conjunctivae and EOM are normal. No scleral icterus.  Neck: Normal range of motion. Neck supple. No JVD present. No thyromegaly present.  Cardiovascular: Normal rate, regular rhythm and intact distal pulses. Exam reveals no gallop and no friction rub.  No murmur heard. Respiratory: Effort normal and breath sounds normal. No respiratory distress. She has no wheezes. She has no rales.  GI: Soft. Bowel sounds are normal. She exhibits no distension. There is abdominal tenderness.  Musculoskeletal: Normal range of motion.        General: No edema.     Comments: No arthritis, no gout  Lymphadenopathy:    She has no cervical adenopathy.  Neurological: She is alert and oriented to person, place, and time. No cranial nerve deficit.  No dysarthria, no aphasia  Skin: Skin is warm and dry. No rash noted. No erythema.  Psychiatric: She has a normal mood and affect. Her behavior is normal. Judgment and thought content normal.    LABORATORY PANEL:   CBC Recent Labs  Lab 12/21/18 1821  WBC 12.7*  HGB 12.4  HCT 37.8  PLT 245   ------------------------------------------------------------------------------------------------------------------  Chemistries  Recent Labs  Lab 12/21/18 1821  NA 139  K 3.6  CL 101  CO2 25  GLUCOSE 171*  BUN 13  CREATININE 0.57  CALCIUM 9.1  AST 2,347*  ALT 1,553*  ALKPHOS 337*  BILITOT 2.5*   ------------------------------------------------------------------------------------------------------------------  Cardiac Enzymes No results for input(s): TROPONINI in the last 168 hours. ------------------------------------------------------------------------------------------------------------------  RADIOLOGY:  Ct Abdomen Pelvis W Contrast  Result Date: 12/21/2018 CLINICAL DATA:  58 year old female with upper abdominal pain. EXAM: CT ABDOMEN AND PELVIS WITH CONTRAST TECHNIQUE: Multidetector CT imaging of the abdomen and pelvis was performed using the standard  protocol following bolus administration of intravenous contrast. CONTRAST:  162mL OMNIPAQUE IOHEXOL 300 MG/ML  SOLN COMPARISON:  Abdominal ultrasound dated 12/16/2018 FINDINGS: Lower chest: Minimal bibasilar dependent atelectatic changes. No intra-abdominal free air. No significant free fluid. Hepatobiliary: The liver is unremarkable. No intrahepatic biliary ductal dilatation. Cholecystectomy. Pancreas: There is apparent minimal haziness of the pancreas concerning for early acute pancreatitis. Correlation with pancreatic enzymes recommended. No fluid collection or abscess. Spleen: Normal in size without focal abnormality. Adrenals/Urinary Tract: The adrenal glands are unremarkable. There is no hydronephrosis on either side. There is symmetric enhancement and excretion of contrast by both kidneys. The visualized ureters and urinary bladder appear unremarkable. Stomach/Bowel: Minimal diffuse thickened appearance of the colon, likely related to underdistention. Mild colitis is less likely. Clinical correlation is recommended. There are several colonic diverticula in the ascending colon and adjacent to the ileocecal bowel without active inflammatory changes. Normal caliber fecalized loops of distal small bowel likely represent increased transit time or small intestine bacterial overgrowth. Clinical correlation is recommended. There is no bowel obstruction. The appendix is normal. Vascular/Lymphatic: The abdominal aorta and IVC appear unremarkable. No portal venous gas. There is no adenopathy. Reproductive: The uterus and ovaries are grossly unremarkable. No pelvic  mass. Other: None Musculoskeletal: No acute or significant osseous findings. IMPRESSION: 1. Findings likely represent an early/mild acute pancreatitis. Correlation with pancreatic enzymes recommended. 2. Underdistention of the colon versus less likely mild colitis. Clinical correlation is recommended. No bowel obstruction. Normal appendix. Electronically  Signed   By: Anner Crete M.D.   On: 12/21/2018 20:32    EKG:   Orders placed or performed during the hospital encounter of 12/21/18  . EKG 12-Lead  . EKG 12-Lead    IMPRESSION AND PLAN:  Principal Problem:   Acute pancreatitis -lipase is severely elevated, greater than 10,000.  We will keep her n.p.o., give aggressive IV fluids, and treat her pain and nausea PRN, and check her lipase again in the morning.  We will get a GI consult Active Problems:   Transaminitis -GI consult as above, unclear etiology for her transaminitis given her largely negative imaging.  On chart review the patient also had hepatitis panel checked within the last 6 months which was negative, and she states she has not done any traveling for the past year.  Recheck CMP with morning labs   Dehydration -aggressive IV fluids as above   HLD (hyperlipidemia) -Home dose antilipid once patient is taking p.o. again  Chart review performed and case discussed with ED provider. Labs, imaging and/or ECG reviewed by provider and discussed with patient/family. Management plans discussed with the patient and/or family.  DVT PROPHYLAXIS: SubQ lovenox   GI PROPHYLAXIS:  None  ADMISSION STATUS: Inpatient     CODE STATUS: Full  TOTAL TIME TAKING CARE OF THIS PATIENT: 45 minutes.   Ethlyn Daniels 12/21/2018, 10:52 PM  [facility]  Office  [phone]  CC: Primary care physician; Glendon Axe, MD  Note:  This document was prepared using Dragon voice recognition software and may include unintentional dictation errors.

## 2018-12-21 NOTE — ED Notes (Signed)
Dr Quentin Cornwall at bedside with interpreter discussing admission

## 2018-12-21 NOTE — ED Provider Notes (Signed)
[facility] Emergency Department Provider Note    First MD Initiated Contact with Patient 12/21/18 1835     (approximate)  I have reviewed the triage vital signs and the nursing notes.   HISTORY  Chief Complaint Abdominal Pain    HPI Madison Harvey is a 58 y.o. female below listed past medical history presents the ER for evaluation of midepigastric abdominal pain.  States she was evaluated for similar symptoms 1 week prior but this is most severe.  Not having any diarrhea.  No dysuria.  No measured fevers.  No chest pain or shortness of breath.  States is causing severe abdominal pain and nausea with vomiting.  Nothing that she does particularly make symptoms worse or better.  An ultrasound ordered in clinic was reassuring but patient was unaware of these results today.    Past Medical History:  Diagnosis Date  . Hyperlipidemia    Family History  Problem Relation Age of Onset  . Breast cancer Neg Hx    Past Surgical History:  Procedure Laterality Date  . CHOLECYSTECTOMY    . COLONOSCOPY WITH PROPOFOL N/A 05/17/2015   Procedure: COLONOSCOPY WITH PROPOFOL;  Surgeon: Manya Silvas, MD;  Location: [facility] ENDOSCOPY;  Service: Endoscopy;  Laterality: N/A;   There are no active problems to display for this patient.     Prior to Admission medications   Not on File    Allergies Patient has no known allergies.    Social History Social History   Tobacco Use  . Smoking status: Never Smoker  . Smokeless tobacco: Never Used  Substance Use Topics  . Alcohol use: No  . Drug use: No    Review of Systems Patient denies headaches, rhinorrhea, blurry vision, numbness, shortness of breath, chest pain, edema, cough, abdominal pain, nausea, vomiting, diarrhea, dysuria, fevers, rashes or hallucinations unless otherwise stated above in HPI. ____________________________________________   PHYSICAL EXAM:  VITAL SIGNS: Vitals:   12/21/18 1804 12/21/18  1959  BP: 90/60 118/63  Pulse: 65 71  Resp: 20 17  Temp: (!) 97.5 F (36.4 C)   SpO2: 100% 98%    Constitutional: Alert and oriented. Uncomfortable appearing Eyes: Conjunctivae are normal.  Head: Atraumatic. Nose: No congestion/rhinnorhea. Mouth/Throat: Mucous membranes are moist.   Neck: No stridor. Painless ROM.  Cardiovascular: Normal rate, regular rhythm. Grossly normal heart sounds.  Good peripheral circulation. Respiratory: Normal respiratory effort.  No retractions. Lungs CTAB. Gastrointestinal: Soft with mild epigastric ttp. No distention. No abdominal bruits. No CVA tenderness. Genitourinary:  Musculoskeletal: No lower extremity tenderness nor edema.  No joint effusions. Neurologic:  Normal speech and language. No gross focal neurologic deficits are appreciated. No facial droop Skin:  Skin is warm, dry and intact. No rash noted. Psychiatric: Mood and affect are normal. Speech and behavior are normal.  ____________________________________________   LABS (all labs ordered are listed, but only abnormal results are displayed)  Results for orders placed or performed during the hospital encounter of 12/21/18 (from the past 24 hour(s))  Comprehensive metabolic panel     Status: Abnormal   Collection Time: 12/21/18  6:21 PM  Result Value Ref Range   Sodium 139 135 - 145 mmol/L   Potassium 3.6 3.5 - 5.1 mmol/L   Chloride 101 98 - 111 mmol/L   CO2 25 22 - 32 mmol/L   Glucose, Bld 171 (H) 70 - 99 mg/dL   BUN 13 6 - 20 mg/dL   Creatinine, Ser 0.57 0.44 - 1.00 mg/dL  Calcium 9.1 8.9 - 10.3 mg/dL   Total Protein 7.2 6.5 - 8.1 g/dL   Albumin 4.1 3.5 - 5.0 g/dL   AST 2,347 (H) 15 - 41 U/L   ALT 1,553 (H) 0 - 44 U/L   Alkaline Phosphatase 337 (H) 38 - 126 U/L   Total Bilirubin 2.5 (H) 0.3 - 1.2 mg/dL   GFR calc non Af Amer >60 >60 mL/min   GFR calc Af Amer >60 >60 mL/min   Anion gap 13 5 - 15  CBC     Status: Abnormal   Collection Time: 12/21/18  6:21 PM  Result Value Ref  Range   WBC 12.7 (H) 4.0 - 10.5 K/uL   RBC 4.29 3.87 - 5.11 MIL/uL   Hemoglobin 12.4 12.0 - 15.0 g/dL   HCT 37.8 36.0 - 46.0 %   MCV 88.1 80.0 - 100.0 fL   MCH 28.9 26.0 - 34.0 pg   MCHC 32.8 30.0 - 36.0 g/dL   RDW 13.2 11.5 - 15.5 %   Platelets 245 150 - 400 K/uL   nRBC 0.6 (H) 0.0 - 0.2 %  Urinalysis, Complete w Microscopic     Status: Abnormal   Collection Time: 12/21/18  6:21 PM  Result Value Ref Range   Color, Urine AMBER (A) YELLOW   APPearance CLEAR (A) CLEAR   Specific Gravity, Urine 1.024 1.005 - 1.030   pH 8.0 5.0 - 8.0   Glucose, UA NEGATIVE NEGATIVE mg/dL   Hgb urine dipstick NEGATIVE NEGATIVE   Bilirubin Urine NEGATIVE NEGATIVE   Ketones, ur 5 (A) NEGATIVE mg/dL   Protein, ur 100 (A) NEGATIVE mg/dL   Nitrite NEGATIVE NEGATIVE   Leukocytes, UA NEGATIVE NEGATIVE   RBC / HPF 11-20 0 - 5 RBC/hpf   WBC, UA 0-5 0 - 5 WBC/hpf   Bacteria, UA NONE SEEN NONE SEEN   Squamous Epithelial / LPF 0-5 0 - 5   Mucus PRESENT   Pregnancy, urine     Status: None   Collection Time: 12/21/18  6:21 PM  Result Value Ref Range   Preg Test, Ur NEGATIVE NEGATIVE  Acetaminophen level     Status: Abnormal   Collection Time: 12/21/18  8:13 PM  Result Value Ref Range   Acetaminophen (Tylenol), Serum <10 (L) 10 - 30 ug/mL   ____________________________________________  EKG My review and personal interpretation at Time: 18:11   Indication: epigastric pain Rate: 65  Rhythm: sinus Axis: normal  Other: normal intervals, no stemi ____________________________________________  RADIOLOGY  I personally reviewed all radiographic images ordered to evaluate for the above acute complaints and reviewed radiology reports and findings.  These findings were personally discussed with the patient.  Please see medical record for radiology report.  ____________________________________________   PROCEDURES  Procedure(s) performed:  .Critical Care Performed by: Merlyn Lot, MD Authorized by:  Merlyn Lot, MD   Critical care provider statement:    Critical care time (minutes):  40   Critical care was necessary to treat or prevent imminent or life-threatening deterioration of the following conditions:  Dehydration (pancreatitis with dehydration)   Critical care was time spent personally by me on the following activities:  Discussions with consultants, evaluation of patient's response to treatment, examination of patient, ordering and performing treatments and interventions, ordering and review of laboratory studies, ordering and review of radiographic studies, pulse oximetry, re-evaluation of patient's condition, obtaining history from patient or surrogate and review of old charts      Critical Care performed: yes ____________________________________________  INITIAL IMPRESSION / ASSESSMENT AND PLAN / ED COURSE  Pertinent labs & imaging results that were available during my care of the patient were reviewed by me and considered in my medical decision making (see chart for details).   DDX: [pancreatitis, mass, enteritis, gastritis, hepatitis  Katanya Schlie is a 58 y.o. who presents to the ED with symptoms as described above.  Patient afebrile but very uncomfortable appearing.  Does have tenderness to palpation in the epigastric region.  She status post cholecystectomy with reassuring recent renal ultrasound.  Certainly concerning for pancreatitis blood work as well as IV fluids, IV pain medication and CT imaging will be ordered to evaluate for the above differential.  Clinical Course as of Dec 21 2128  Wed Dec 21, 2018  2050 With evidence of acute pancreatitis by CT likely causing obstruction bili obstruction with transaminitis.  No evidence of stone.  Will consult GI.   [PR]  2056 Discussed the case in consultation with Dr. Alice Reichert regarding the patient's presentation laboratory abnormalities and CT imaging.  This point believe patient stable and appropriate for admission  at this facility.   [PR]    Clinical Course User Index [PR] Merlyn Lot, MD     As part of my medical decision making, I reviewed the following data within the [place] notes reviewed and incorporated, Labs reviewed, notes from prior ED visits and [place] Controlled Substance Database   ____________________________________________   FINAL CLINICAL IMPRESSION(S) / ED DIAGNOSES  Final diagnoses:  Abdominal pain, acute, epigastric  Acute pancreatitis, unspecified complication status, unspecified pancreatitis type      NEW MEDICATIONS STARTED DURING THIS VISIT:  New Prescriptions   No medications on file     Note:  This document was prepared using Dragon voice recognition software and may include unintentional dictation errors.    Merlyn Lot, MD 12/21/18 2130

## 2018-12-22 ENCOUNTER — Other Ambulatory Visit: Payer: Self-pay

## 2018-12-22 ENCOUNTER — Inpatient Hospital Stay: Payer: BLUE CROSS/BLUE SHIELD

## 2018-12-22 DIAGNOSIS — K859 Acute pancreatitis without necrosis or infection, unspecified: Principal | ICD-10-CM

## 2018-12-22 LAB — COMPREHENSIVE METABOLIC PANEL
ALT: 1428 U/L — ABNORMAL HIGH (ref 0–44)
ANION GAP: 6 (ref 5–15)
AST: 1362 U/L — ABNORMAL HIGH (ref 15–41)
Albumin: 3.3 g/dL — ABNORMAL LOW (ref 3.5–5.0)
Alkaline Phosphatase: 295 U/L — ABNORMAL HIGH (ref 38–126)
BUN: 11 mg/dL (ref 6–20)
CO2: 25 mmol/L (ref 22–32)
Calcium: 7.9 mg/dL — ABNORMAL LOW (ref 8.9–10.3)
Chloride: 109 mmol/L (ref 98–111)
Creatinine, Ser: 0.45 mg/dL (ref 0.44–1.00)
GFR calc Af Amer: >60 mL/min (ref >60)
GFR calc non Af Amer: >60 mL/min (ref >60)
Glucose, Bld: 135 mg/dL — ABNORMAL HIGH (ref 70–99)
Potassium: 3.5 mmol/L (ref 3.5–5.1)
Sodium: 140 mmol/L (ref 135–145)
Total Bilirubin: 1.5 mg/dL — ABNORMAL HIGH (ref 0.3–1.2)
Total Protein: 6.2 g/dL — ABNORMAL LOW (ref 6.5–8.1)

## 2018-12-22 LAB — CBC
HCT: 33.9 % — ABNORMAL LOW (ref 36.0–46.0)
Hemoglobin: 10.9 g/dL — ABNORMAL LOW (ref 12.0–15.0)
MCH: 29.2 pg (ref 26.0–34.0)
MCHC: 32.2 g/dL (ref 30.0–36.0)
MCV: 90.9 fL (ref 80.0–100.0)
Platelets: 230 10*3/uL (ref 150–400)
RBC: 3.73 MIL/uL — AB (ref 3.87–5.11)
RDW: 13.5 % (ref 11.5–15.5)
WBC: 12.4 10*3/uL — ABNORMAL HIGH (ref 4.0–10.5)
nRBC: 0 % (ref 0.0–0.2)

## 2018-12-22 LAB — LIPASE, BLOOD: Lipase: 1493 U/L — ABNORMAL HIGH (ref 11–51)

## 2018-12-22 LAB — FERRITIN: Ferritin: 382 ng/mL — ABNORMAL HIGH (ref 11–307)

## 2018-12-22 MED ORDER — LACTATED RINGERS IV SOLN
INTRAVENOUS | Status: DC
  Administered 2018-12-22 – 2018-12-23 (×6): via INTRAVENOUS

## 2018-12-22 NOTE — Consult Note (Addendum)
[name], MD [address] [place], [place], [place], [address] [address], [place], [place], [place], [address] Phone: [phone]  Fax: [phone]  Consultation  Referring Provider:     Dr. Stark Jock Primary Care Physician:  Glendon Axe, MD Reason for Consultation:     Abdominal pain, pancreatitis  Date of Admission:  12/21/2018 Date of Consultation:  12/22/2018         HPI:   Madison Harvey is a 58 y.o. female presents with abdominal pain that started yesterday.  Midepigastric region, sharp, 10/10, radiating, associated with nausea and vomiting.  No hematemesis.  No altered bowel habits.  No diarrhea.  No weight loss.  Patient diagnosed with pancreatitis on admission based on elevated lipase to over 10,000 and imaging.  Patient denies any previous history of pancreatitis.  No alcohol use or smoking.  Herbal products or over-the-counter supplements.  Triglycerides around 150.Marland Kitchen  Patient has previous history of cholecystectomy 67 years ago.  Patient reports similar pain at that time which resolved after surgery.  States over the last 1 year she has had intermittent abdominal pain but never as severe as this.    Past Medical History:  Diagnosis Date  . Hyperlipidemia     Past Surgical History:  Procedure Laterality Date  . CHOLECYSTECTOMY    . COLONOSCOPY WITH PROPOFOL N/A 05/17/2015   Procedure: COLONOSCOPY WITH PROPOFOL;  Surgeon: Manya Silvas, MD;  Location: [facility] ENDOSCOPY;  Service: Endoscopy;  Laterality: N/A;    Prior to Admission medications   Not on File    Family History  Problem Relation Age of Onset  . Arthritis Mother   . Thyroid disease Mother   . Thyroid disease Brother   . Breast cancer Neg Hx      Social History   Tobacco Use  . Smoking status: Never Smoker  . Smokeless tobacco: Never Used  Substance Use Topics  . Alcohol use: No  . Drug use: No    Allergies as of 12/21/2018  . (No Known Allergies)    Review of Systems:      All systems reviewed and negative except where noted in HPI.   Physical Exam:  Vital signs in last 24 hours: Vitals:   12/21/18 1813 12/21/18 1959 12/21/18 2239 12/21/18 2333  BP:  118/63 102/68 131/75  Pulse:  71 78 68  Resp:  17 17 16   Temp:   98 F (36.7 C) 98.3 F (36.8 C)  TempSrc:   Oral Oral  SpO2:  98% 98% 97%  Weight: 61.7 kg     Height: 5\' 1"  (1.549 m)      Last BM Date: 12/20/18 General:   Pleasant, cooperative in NAD Head:  Normocephalic and atraumatic. Eyes:   No icterus.   Conjunctiva pink. PERRLA. Ears:  Normal auditory acuity. Neck:  Supple; no masses or thyroidomegaly Lungs: Respirations even and unlabored. Lungs clear to auscultation bilaterally.   No wheezes, crackles, or rhonchi.  Abdomen:  Soft, nondistended, nontender. Normal bowel sounds. No appreciable masses or hepatomegaly.  No rebound or guarding.  Neurologic:  Alert and oriented x3;  grossly normal neurologically. Skin:  Intact without significant lesions or rashes. Cervical Nodes:  No significant cervical adenopathy. Psych:  Alert and cooperative. Normal affect.  LAB RESULTS: Recent Labs    12/21/18 1821 12/22/18 0434  WBC 12.7* 12.4*  HGB 12.4 10.9*  HCT 37.8 33.9*  PLT 245 230   BMET Recent Labs    12/21/18 1821 12/22/18 0434  NA 139 140  K 3.6 3.5  CL 101 109  CO2 25 25  GLUCOSE 171* 135*  BUN 13 11  CREATININE 0.57 0.45  CALCIUM 9.1 7.9*   LFT Recent Labs    12/22/18 0434  PROT 6.2*  ALBUMIN 3.3*  AST 1,362*  ALT 1,428*  ALKPHOS 295*  BILITOT 1.5*   PT/INR Recent Labs    12/21/18 2153  LABPROT 13.4  INR 1.03    STUDIES: Ct Abdomen Pelvis W Contrast  Result Date: 12/21/2018 CLINICAL DATA:  58 year old female with upper abdominal pain. EXAM: CT ABDOMEN AND PELVIS WITH CONTRAST TECHNIQUE: Multidetector CT imaging of the abdomen and pelvis was performed using the standard protocol following bolus administration of intravenous contrast. CONTRAST:  140mL  OMNIPAQUE IOHEXOL 300 MG/ML  SOLN COMPARISON:  Abdominal ultrasound dated 12/16/2018 FINDINGS: Lower chest: Minimal bibasilar dependent atelectatic changes. No intra-abdominal free air. No significant free fluid. Hepatobiliary: The liver is unremarkable. No intrahepatic biliary ductal dilatation. Cholecystectomy. Pancreas: There is apparent minimal haziness of the pancreas concerning for early acute pancreatitis. Correlation with pancreatic enzymes recommended. No fluid collection or abscess. Spleen: Normal in size without focal abnormality. Adrenals/Urinary Tract: The adrenal glands are unremarkable. There is no hydronephrosis on either side. There is symmetric enhancement and excretion of contrast by both kidneys. The visualized ureters and urinary bladder appear unremarkable. Stomach/Bowel: Minimal diffuse thickened appearance of the colon, likely related to underdistention. Mild colitis is less likely. Clinical correlation is recommended. There are several colonic diverticula in the ascending colon and adjacent to the ileocecal bowel without active inflammatory changes. Normal caliber fecalized loops of distal small bowel likely represent increased transit time or small intestine bacterial overgrowth. Clinical correlation is recommended. There is no bowel obstruction. The appendix is normal. Vascular/Lymphatic: The abdominal aorta and IVC appear unremarkable. No portal venous gas. There is no adenopathy. Reproductive: The uterus and ovaries are grossly unremarkable. No pelvic mass. Other: None Musculoskeletal: No acute or significant osseous findings. IMPRESSION: 1. Findings likely represent an early/mild acute pancreatitis. Correlation with pancreatic enzymes recommended. 2. Underdistention of the colon versus less likely mild colitis. Clinical correlation is recommended. No bowel obstruction. Normal appendix. Electronically Signed   By: Anner Crete M.D.   On: 12/21/2018 20:32      Impression / Plan:    Madison Harvey is a 58 y.o. y/o female with abdominal pain and pancreatitis  Etiology of pancreatitis is unknown at this time Triglycerides are not high enough to cause pancreatitis in this case.  Patient has very elevated liver enzymes Imaging has not shown any bile duct dilation.  However, given elevated liver enzymes please obtain MRCP to rule out biliary duct stone obstruction.  We will order work-up for autoimmune pancreatitis with IgG4 We will order hepatitis work-up as well given elevated liver enzymes  Normal albumin and INR show normal liver synthetic function.  No evidence of liver failure. Avoid hepatotoxic drugs Continue daily CMP  Management for acute pancreatitis includes pain control and IV fluids Would recommend lactated Ringer's at 250 cc to 300/h for the first 24 to 48 hours and then decreasing the rate as her oral intake improves.  Abdominal pain is improving.   Thank you for involving me in the care of this patient.      LOS: 1 day   Virgel Manifold, MD  12/22/2018, 8:51 AM

## 2018-12-22 NOTE — Progress Notes (Signed)
[facility] [place] NAME: Madison Harvey    MR#:  [id number]  DATE OF BIRTH:  16-Jul-1961  SUBJECTIVE:  CHIEF COMPLAINT:   Chief Complaint  Patient presents with  . Abdominal Pain   No new complaint is present.  No nausea vomiting.  Abdominal pain significantly improved.  Started on clear liquids this morning.  Seen by gastroenterology service today.  REVIEW OF SYSTEMS:  Review of Systems  Constitutional: Negative for chills, fever and weight loss.  HENT: Negative for ear pain, hearing loss and tinnitus.   Eyes: Negative for blurred vision, double vision and photophobia.  Respiratory: Negative for cough, hemoptysis and sputum production.   Cardiovascular: Negative for chest pain, palpitations and orthopnea.  Gastrointestinal: Positive for abdominal pain. Negative for diarrhea, heartburn, nausea and vomiting.  Genitourinary: Negative for dysuria.  Neurological: Negative for dizziness and headaches.  Psychiatric/Behavioral: Negative for depression and substance abuse.      DRUG ALLERGIES:  No Known Allergies VITALS:  Blood pressure 110/62, pulse 68, temperature 99 F (37.2 C), temperature source Oral, resp. rate 17, height 5\' 1"  (1.549 m), weight 61.7 kg, SpO2 97 %. PHYSICAL EXAMINATION:   Physical Exam  Constitutional: She is oriented to person, place, and time and well-developed, well-nourished, and in no distress.  Dry oral mucosa  HENT:  Head: Normocephalic and atraumatic.  Eyes: Conjunctivae and EOM are normal.  Neck: Neck supple. No tracheal deviation present.  Cardiovascular: Normal rate, regular rhythm and normal heart sounds.  Pulmonary/Chest: Effort normal and breath sounds normal. She has no wheezes.  Abdominal: Soft. Bowel sounds are normal. She exhibits no distension.  Mild epigastric tenderness.  No rebound or guarding  Musculoskeletal: Normal range of motion.        General: No edema.  Neurological: She is alert  and oriented to person, place, and time. No cranial nerve deficit.  Skin: Skin is warm. She is not diaphoretic. No erythema.  Psychiatric: Affect and judgment normal.   LABORATORY PANEL:  Female CBC Recent Labs  Lab 12/22/18 0434  WBC 12.4*  HGB 10.9*  HCT 33.9*  PLT 230   ------------------------------------------------------------------------------------------------------------------ Chemistries  Recent Labs  Lab 12/22/18 0434  NA 140  K 3.5  CL 109  CO2 25  GLUCOSE 135*  BUN 11  CREATININE 0.45  CALCIUM 7.9*  AST 1,362*  ALT 1,428*  ALKPHOS 295*  BILITOT 1.5*   RADIOLOGY:  Ct Abdomen Pelvis W Contrast  Result Date: 12/21/2018 CLINICAL DATA:  58 year old female with upper abdominal pain. EXAM: CT ABDOMEN AND PELVIS WITH CONTRAST TECHNIQUE: Multidetector CT imaging of the abdomen and pelvis was performed using the standard protocol following bolus administration of intravenous contrast. CONTRAST:  127mL OMNIPAQUE IOHEXOL 300 MG/ML  SOLN COMPARISON:  Abdominal ultrasound dated 12/16/2018 FINDINGS: Lower chest: Minimal bibasilar dependent atelectatic changes. No intra-abdominal free air. No significant free fluid. Hepatobiliary: The liver is unremarkable. No intrahepatic biliary ductal dilatation. Cholecystectomy. Pancreas: There is apparent minimal haziness of the pancreas concerning for early acute pancreatitis. Correlation with pancreatic enzymes recommended. No fluid collection or abscess. Spleen: Normal in size without focal abnormality. Adrenals/Urinary Tract: The adrenal glands are unremarkable. There is no hydronephrosis on either side. There is symmetric enhancement and excretion of contrast by both kidneys. The visualized ureters and urinary bladder appear unremarkable. Stomach/Bowel: Minimal diffuse thickened appearance of the colon, likely related to underdistention. Mild colitis is less likely. Clinical correlation is recommended. There are several colonic diverticula in  the ascending colon and adjacent to the ileocecal bowel without active inflammatory changes. Normal caliber fecalized loops of distal small bowel likely represent increased transit time or small intestine bacterial overgrowth. Clinical correlation is recommended. There is no bowel obstruction. The appendix is normal. Vascular/Lymphatic: The abdominal aorta and IVC appear unremarkable. No portal venous gas. There is no adenopathy. Reproductive: The uterus and ovaries are grossly unremarkable. No pelvic mass. Other: None Musculoskeletal: No acute or significant osseous findings. IMPRESSION: 1. Findings likely represent an early/mild acute pancreatitis. Correlation with pancreatic enzymes recommended. 2. Underdistention of the colon versus less likely mild colitis. Clinical correlation is recommended. No bowel obstruction. Normal appendix. Electronically Signed   By: Anner Crete M.D.   On: 12/21/2018 20:32   ASSESSMENT AND PLAN:    1. Acute pancreatitis - Lipase was severely elevated, greater than 10,000.  Lipase level already trending down to 1493. Abdominal pains significantly improved.  Denies any nausea vomiting.  Initiated clear liquid diet.  Patient still appears very dehydrated. IV fluids changed to Ringer's lactate at 250 cc an hour for the next 24 hours as recommended by GI.  Will decrease rate as oral intake improves. Patient noted to have elevated liver enzymes.  MRCP requested to rule out biliary stone obstruction.  Patient status post previous cholecystectomy. No history of alcohol abuse.  Requested for lipid panel. Work-up for autoimmune pancreatitis with IgG and hepatitis work-up also requested.  Normal albumin and INR as such no evidence of liver failure.  Avoid hepatotoxic drugs.  2.  Elevated liver enzymes Being evaluated as mentioned above by gastroenterology service. Follow-up on LFTs in a.m.  3.  Dehydration IV fluid hydration.  4.  HLD (hyperlipidemia) - Lipid panel in  a.m.  DVT prophylaxis; Lovenox   All the records are reviewed and case discussed with Care Management/Social Worker. Management plans discussed with the patient, family and they are in agreement.  CODE STATUS: Full Code  TOTAL TIME TAKING CARE OF THIS PATIENT: 33 minutes.   More than 50% of the time was spent in counseling/coordination of care: YES  POSSIBLE D/C IN 2 DAYS, DEPENDING ON CLINICAL CONDITION.   Berdena Cisek M.D on 12/22/2018 at 1:36 PM  Between 7am to 6pm - Pager - [phone]  After 6pm go to [email or web address] - Proofreader  [facility]  Office  [phone]  CC: Primary care physician; Glendon Axe, MD  Note: This dictation was prepared with Dragon dictation along with smaller phrase technology. Any transcriptional errors that result from this process are unintentional.

## 2018-12-22 NOTE — Progress Notes (Signed)
   12/22/18 1100  Clinical Encounter Type  Visited With Patient and family together  Visit Type Initial  Referral From Physician  Spiritual Encounters  Spiritual Needs Brochure    Chaplain received and OR to complete or update and AD. The patient was in bed, but awake and alert. Her daughter was at the bedside. A Spanish interpreter reserved for the conversation as noted. Chaplain provided a brief overview of the document and found that the patient originally thought this visit was regarding payment for her care. I assured her about the purpose and provided education on AD's. She will review the document (Spanish version) and ask her nurse to call or page a chaplain if any questions arise.

## 2018-12-22 NOTE — Plan of Care (Signed)
Discussed with patient and family plan of care for the evening, pain management and her clear liquid diet and nothing by mouth for procedure tomorrow with some teach back displayed

## 2018-12-23 DIAGNOSIS — R748 Abnormal levels of other serum enzymes: Secondary | ICD-10-CM

## 2018-12-23 LAB — COMPREHENSIVE METABOLIC PANEL
ALT: 753 U/L — ABNORMAL HIGH (ref 0–44)
AST: 261 U/L — ABNORMAL HIGH (ref 15–41)
Albumin: 2.7 g/dL — ABNORMAL LOW (ref 3.5–5.0)
Alkaline Phosphatase: 192 U/L — ABNORMAL HIGH (ref 38–126)
Anion gap: 3 — ABNORMAL LOW (ref 5–15)
BUN: 7 mg/dL (ref 6–20)
CHLORIDE: 111 mmol/L (ref 98–111)
CO2: 29 mmol/L (ref 22–32)
Calcium: 8 mg/dL — ABNORMAL LOW (ref 8.9–10.3)
Creatinine, Ser: 0.45 mg/dL (ref 0.44–1.00)
GFR calc Af Amer: >60 mL/min (ref >60)
GFR calc non Af Amer: >60 mL/min (ref >60)
Glucose, Bld: 99 mg/dL (ref 70–99)
POTASSIUM: 3.4 mmol/L — AB (ref 3.5–5.1)
Sodium: 143 mmol/L (ref 135–145)
Total Bilirubin: 0.8 mg/dL (ref 0.3–1.2)
Total Protein: 5 g/dL — ABNORMAL LOW (ref 6.5–8.1)

## 2018-12-23 LAB — CBC
HEMATOCRIT: 29.8 % — AB (ref 36.0–46.0)
HEMOGLOBIN: 9.4 g/dL — AB (ref 12.0–15.0)
MCH: 29.4 pg (ref 26.0–34.0)
MCHC: 31.5 g/dL (ref 30.0–36.0)
MCV: 93.1 fL (ref 80.0–100.0)
Platelets: 171 10*3/uL (ref 150–400)
RBC: 3.2 MIL/uL — ABNORMAL LOW (ref 3.87–5.11)
RDW: 14 % (ref 11.5–15.5)
WBC: 8 10*3/uL (ref 4.0–10.5)
nRBC: 0 % (ref 0.0–0.2)

## 2018-12-23 LAB — IGG 4: IgG, Subclass 4: 16 mg/dL (ref 2–96)

## 2018-12-23 LAB — LIPID PANEL
Cholesterol: 152 mg/dL (ref 0–200)
HDL: 47 mg/dL (ref >40)
LDL Cholesterol: 87 mg/dL (ref 0–99)
Total CHOL/HDL Ratio: 3.2 RATIO
Triglycerides: 90 mg/dL (ref <150)
VLDL: 18 mg/dL (ref 0–40)

## 2018-12-23 LAB — HEPATITIS B SURFACE ANTIBODY, QUANTITATIVE: Hep B S AB Quant (Post): >1000 m[IU]/mL (ref >9.9)

## 2018-12-23 LAB — HEPATITIS B SURFACE ANTIGEN: Hepatitis B Surface Ag: NEGATIVE

## 2018-12-23 LAB — HEPATITIS B DNA, ULTRAQUANTITATIVE, PCR
HBV DNA SERPL PCR-ACNC: NOT DETECTED IU/mL
HBV DNA SERPL PCR-LOG IU: UNDETERMINED log10 IU/mL

## 2018-12-23 LAB — HIV ANTIBODY (ROUTINE TESTING W REFLEX): HIV Screen 4th Generation wRfx: NONREACTIVE

## 2018-12-23 LAB — HEPATITIS A ANTIBODY, IGM: Hep A IgM: NEGATIVE

## 2018-12-23 LAB — MAGNESIUM: Magnesium: 1.9 mg/dL (ref 1.7–2.4)

## 2018-12-23 LAB — HCV COMMENT:

## 2018-12-23 LAB — HEPATITIS B CORE ANTIBODY, TOTAL: Hep B Core Total Ab: POSITIVE — AB

## 2018-12-23 LAB — HEPATITIS A ANTIBODY, TOTAL: Hep A Total Ab: POSITIVE — AB

## 2018-12-23 LAB — CERULOPLASMIN: Ceruloplasmin: 23.5 mg/dL (ref 19.0–39.0)

## 2018-12-23 LAB — HEPATITIS C ANTIBODY (REFLEX): HCV Ab: <0.1 s/co ratio (ref 0.0–0.9)

## 2018-12-23 LAB — HEPATITIS B CORE ANTIBODY, IGM: Hep B C IgM: NEGATIVE

## 2018-12-23 LAB — LIPASE, BLOOD: Lipase: 83 U/L — ABNORMAL HIGH (ref 11–51)

## 2018-12-23 LAB — MITOCHONDRIAL ANTIBODIES: Mitochondrial M2 Ab, IgG: <20 Units (ref 0.0–20.0)

## 2018-12-23 LAB — ANTI-SMOOTH MUSCLE ANTIBODY, IGG: F-ACTIN AB IGG: 5 U (ref 0–19)

## 2018-12-23 MED ORDER — LACTATED RINGERS IV BOLUS
1000.0000 mL | Freq: Once | INTRAVENOUS | Status: AC
  Administered 2018-12-23: 1000 mL via INTRAVENOUS

## 2018-12-23 MED ORDER — POTASSIUM CHLORIDE CRYS ER 20 MEQ PO TBCR
40.0000 meq | EXTENDED_RELEASE_TABLET | Freq: Once | ORAL | Status: AC
  Administered 2018-12-23: 40 meq via ORAL
  Filled 2018-12-23: qty 2

## 2018-12-23 MED ORDER — GADOBUTROL 1 MMOL/ML IV SOLN
6.0000 mL | Freq: Once | INTRAVENOUS | Status: AC | PRN
  Administered 2018-12-23: 6 mL via INTRAVENOUS

## 2018-12-23 NOTE — Progress Notes (Signed)
[name], MD [address], [place], [place], [place], [address] [address], [place], [place], [place], [address] Phone: [phone]  Fax: [phone]   Subjective: Patient reports abdominal pain continues to improve.  No nausea or vomiting.  Tolerating oral diet.   Objective: Exam: Vital signs in last 24 hours: Vitals:   12/22/18 2356 12/22/18 2358 12/23/18 0117 12/23/18 1016  BP: (!) 96/53 (!) 77/40 106/63 125/62  Pulse: 68 64 (!) 58 (!) 56  Resp: 18   16  Temp: 98 F (36.7 C)   98.3 F (36.8 C)  TempSrc:    Oral  SpO2: 94%   95%  Weight:      Height:       Weight change:   Intake/Output Summary (Last 24 hours) at 12/23/2018 1604 Last data filed at 12/23/2018 0125 Gross per 24 hour  Intake 4345.41 ml  Output 500 ml  Net 3845.41 ml    General: No acute distress, AAO x3 Abd: Soft, NT/ND, No HSM Skin: Warm, no rashes Neck: Supple, Trachea midline   Lab Results: Lab Results  Component Value Date   WBC 8.0 12/23/2018   HGB 9.4 (L) 12/23/2018   HCT 29.8 (L) 12/23/2018   MCV 93.1 12/23/2018   PLT 171 12/23/2018   Micro Results: No results found for this or any previous visit (from the past 240 hour(s)). Studies/Results: Ct Abdomen Pelvis W Contrast  Result Date: 12/21/2018 CLINICAL DATA:  58 year old female with upper abdominal pain. EXAM: CT ABDOMEN AND PELVIS WITH CONTRAST TECHNIQUE: Multidetector CT imaging of the abdomen and pelvis was performed using the standard protocol following bolus administration of intravenous contrast. CONTRAST:  142mL OMNIPAQUE IOHEXOL 300 MG/ML  SOLN COMPARISON:  Abdominal ultrasound dated 12/16/2018 FINDINGS: Lower chest: Minimal bibasilar dependent atelectatic changes. No intra-abdominal free air. No significant free fluid. Hepatobiliary: The liver is unremarkable. No intrahepatic biliary ductal dilatation. Cholecystectomy. Pancreas: There is apparent minimal haziness of the pancreas concerning for early acute  pancreatitis. Correlation with pancreatic enzymes recommended. No fluid collection or abscess. Spleen: Normal in size without focal abnormality. Adrenals/Urinary Tract: The adrenal glands are unremarkable. There is no hydronephrosis on either side. There is symmetric enhancement and excretion of contrast by both kidneys. The visualized ureters and urinary bladder appear unremarkable. Stomach/Bowel: Minimal diffuse thickened appearance of the colon, likely related to underdistention. Mild colitis is less likely. Clinical correlation is recommended. There are several colonic diverticula in the ascending colon and adjacent to the ileocecal bowel without active inflammatory changes. Normal caliber fecalized loops of distal small bowel likely represent increased transit time or small intestine bacterial overgrowth. Clinical correlation is recommended. There is no bowel obstruction. The appendix is normal. Vascular/Lymphatic: The abdominal aorta and IVC appear unremarkable. No portal venous gas. There is no adenopathy. Reproductive: The uterus and ovaries are grossly unremarkable. No pelvic mass. Other: None Musculoskeletal: No acute or significant osseous findings. IMPRESSION: 1. Findings likely represent an early/mild acute pancreatitis. Correlation with pancreatic enzymes recommended. 2. Underdistention of the colon versus less likely mild colitis. Clinical correlation is recommended. No bowel obstruction. Normal appendix. Electronically Signed   By: Anner Crete M.D.   On: 12/21/2018 20:32   Mr 3d Recon At Scanner  Result Date: 12/23/2018 CLINICAL DATA:  Recurrent mid epigastric pain over the last week. Nausea and vomiting. Elevated liver function studies. Previous cholecystectomy. EXAM: MRI ABDOMEN WITH CONTRAST (WITH MRCP) TECHNIQUE: Multiplanar multisequence MR imaging of the abdomen was performed following the administration of intravenous contrast. Heavily  T2-weighted images of the biliary and pancreatic  ducts were obtained, and three-dimensional MRCP images were rendered by post processing. CONTRAST:  6 cc Gadavist. COMPARISON:  Abdominopelvic CT 12/21/2018 and ultrasound 12/16/2018 FINDINGS: Lower chest: Mild atelectasis at both lung bases. The visualized lower chest otherwise appears unremarkable. Hepatobiliary: The liver is normal in signal without focal lesion or abnormal enhancement. There is no intra or extrahepatic biliary dilatation post cholecystectomy. No evidence of choledocholithiasis. Pancreas: The pancreas is homogeneous in signal and enhancement. A small amount of peripancreatic inflammation is again noted without focal fluid collection. There is no pancreatic ductal dilatation. Spleen: Normal in size without focal abnormality. Adrenals/Urinary Tract: Both adrenal glands appear normal. The kidneys and ureters appear normal. No hydronephrosis. Stomach/Bowel: No evidence of bowel wall thickening, distention or surrounding inflammatory change. Vascular/Lymphatic: There are no enlarged abdominal lymph nodes. No significant vascular findings. The common hepatic and splenic arteries arise separately from the abdominal aorta. Other: No ascites or focal extraluminal fluid collection. Musculoskeletal: No acute or significant osseous findings. IMPRESSION: 1. No biliary dilatation or evidence of choledocholithiasis post cholecystectomy. 2. Mild peripancreatic inflammatory changes consistent with mild uncomplicated pancreatitis. Electronically Signed   By: Richardean Sale M.D.   On: 12/23/2018 08:26   Mr Abdomen With Mrcp W Contrast  Result Date: 12/23/2018 CLINICAL DATA:  Recurrent mid epigastric pain over the last week. Nausea and vomiting. Elevated liver function studies. Previous cholecystectomy. EXAM: MRI ABDOMEN WITH CONTRAST (WITH MRCP) TECHNIQUE: Multiplanar multisequence MR imaging of the abdomen was performed following the administration of intravenous contrast. Heavily T2-weighted images of the  biliary and pancreatic ducts were obtained, and three-dimensional MRCP images were rendered by post processing. CONTRAST:  6 cc Gadavist. COMPARISON:  Abdominopelvic CT 12/21/2018 and ultrasound 12/16/2018 FINDINGS: Lower chest: Mild atelectasis at both lung bases. The visualized lower chest otherwise appears unremarkable. Hepatobiliary: The liver is normal in signal without focal lesion or abnormal enhancement. There is no intra or extrahepatic biliary dilatation post cholecystectomy. No evidence of choledocholithiasis. Pancreas: The pancreas is homogeneous in signal and enhancement. A small amount of peripancreatic inflammation is again noted without focal fluid collection. There is no pancreatic ductal dilatation. Spleen: Normal in size without focal abnormality. Adrenals/Urinary Tract: Both adrenal glands appear normal. The kidneys and ureters appear normal. No hydronephrosis. Stomach/Bowel: No evidence of bowel wall thickening, distention or surrounding inflammatory change. Vascular/Lymphatic: There are no enlarged abdominal lymph nodes. No significant vascular findings. The common hepatic and splenic arteries arise separately from the abdominal aorta. Other: No ascites or focal extraluminal fluid collection. Musculoskeletal: No acute or significant osseous findings. IMPRESSION: 1. No biliary dilatation or evidence of choledocholithiasis post cholecystectomy. 2. Mild peripancreatic inflammatory changes consistent with mild uncomplicated pancreatitis. Electronically Signed   By: Richardean Sale M.D.   On: 12/23/2018 08:26   Medications:  Scheduled Meds: . enoxaparin (LOVENOX) injection  40 mg Subcutaneous Q24H   Continuous Infusions: . lactated ringers 150 mL/hr at 12/23/18 1200   PRN Meds:.ibuprofen, morphine injection, ondansetron **OR** ondansetron (ZOFRAN) IV, ondansetron, oxyCODONE   Assessment: Principal Problem:   Acute pancreatitis Active Problems:   Transaminitis   Dehydration   HLD  (hyperlipidemia)    Plan: MRCP did not show any bile duct dilation or evidence of choledocholithiasis Liver enzymes have significantly improved today  Question if patient had small stones or sludge that have passed and may have been the cause of her pancreatitis  She has already had a cholecystectomy  However, today patient states 2 weeks ago  she started taking Glucosamine and chondroitin supplements. This has not been studied for pancreatitis, but many OTC supplements can have hepatic and pancreatic adverse effects. I have asked her to stop taking this and she verbalized understanding.   So far her liver work-up has only shown previous infection with hepatitis B and now natural immunity due to that. She is also immune to hepatitis A No evidence of acute viral hepatitis. She was informed of the above Hep B and Hep A tests  No evidence of autoimmune hepatitis Ferritin is mildly elevated but not consistent with autoimmune hepatitis at this time IgG4 is normal and therefore no evidence of autoimmune pancreatitis  Continue daily CMP Avoid hepatotoxic drugs Patient tolerating oral diet Continue to advance as tolerated Low-fat diet  Patient should follow-up in GI clinic upon discharge    LOS: 2 days   [name], MD 12/23/2018, 4:04 PM

## 2018-12-23 NOTE — Progress Notes (Signed)
[facility] NAME: Zoella Roberti    MR#:  [id number]  DATE OF BIRTH:  05-Feb-1961  SUBJECTIVE:  CHIEF COMPLAINT:   Chief Complaint  Patient presents with  . Abdominal Pain   No new complaint is present.  No nausea vomiting.  Abdominal pain significantly improved.   Patient tolerating clear liquid diet.  Diet advanced to full liquids today.   REVIEW OF SYSTEMS:  Review of Systems  Constitutional: Negative for chills, fever and weight loss.  HENT: Negative for ear pain, hearing loss and tinnitus.   Eyes: Negative for blurred vision, double vision and photophobia.  Respiratory: Negative for cough, hemoptysis and sputum production.   Cardiovascular: Negative for chest pain, palpitations and orthopnea.  Gastrointestinal: Positive for abdominal pain. Negative for diarrhea, heartburn, nausea and vomiting.  Genitourinary: Negative for dysuria.  Neurological: Negative for dizziness and headaches.  Psychiatric/Behavioral: Negative for depression and substance abuse.      DRUG ALLERGIES:  No Known Allergies VITALS:  Blood pressure 125/62, pulse (!) 56, temperature 98.3 F (36.8 C), temperature source Oral, resp. rate 16, height 5\' 1"  (1.549 m), weight 61.7 kg, SpO2 95 %. PHYSICAL EXAMINATION:   Physical Exam  Constitutional: She is oriented to person, place, and time and well-developed, well-nourished, and in no distress.  HENT:  Head: Normocephalic and atraumatic.  Eyes: Conjunctivae and EOM are normal.  Neck: Neck supple. No tracheal deviation present.  Cardiovascular: Normal rate, regular rhythm and normal heart sounds.  Pulmonary/Chest: Effort normal and breath sounds normal. She has no wheezes.  Abdominal: Soft. Bowel sounds are normal. She exhibits no distension.  Mild epigastric tenderness.  No rebound or guarding  Musculoskeletal: Normal range of motion.        General: No edema.  Neurological: She is alert and oriented  to person, place, and time. No cranial nerve deficit.  Skin: Skin is warm. She is not diaphoretic. No erythema.  Psychiatric: Affect and judgment normal.   LABORATORY PANEL:  Female CBC Recent Labs  Lab 12/23/18 0454  WBC 8.0  HGB 9.4*  HCT 29.8*  PLT 171   ------------------------------------------------------------------------------------------------------------------ Chemistries  Recent Labs  Lab 12/23/18 0454  NA 143  K 3.4*  CL 111  CO2 29  GLUCOSE 99  BUN 7  CREATININE 0.45  CALCIUM 8.0*  MG 1.9  AST 261*  ALT 753*  ALKPHOS 192*  BILITOT 0.8   RADIOLOGY:  Mr 3d Recon At Scanner  Result Date: 12/23/2018 CLINICAL DATA:  Recurrent mid epigastric pain over the last week. Nausea and vomiting. Elevated liver function studies. Previous cholecystectomy. EXAM: MRI ABDOMEN WITH CONTRAST (WITH MRCP) TECHNIQUE: Multiplanar multisequence MR imaging of the abdomen was performed following the administration of intravenous contrast. Heavily T2-weighted images of the biliary and pancreatic ducts were obtained, and three-dimensional MRCP images were rendered by post processing. CONTRAST:  6 cc Gadavist. COMPARISON:  Abdominopelvic CT 12/21/2018 and ultrasound 12/16/2018 FINDINGS: Lower chest: Mild atelectasis at both lung bases. The visualized lower chest otherwise appears unremarkable. Hepatobiliary: The liver is normal in signal without focal lesion or abnormal enhancement. There is no intra or extrahepatic biliary dilatation post cholecystectomy. No evidence of choledocholithiasis. Pancreas: The pancreas is homogeneous in signal and enhancement. A small amount of peripancreatic inflammation is again noted without focal fluid collection. There is no pancreatic ductal dilatation. Spleen: Normal in size without focal abnormality. Adrenals/Urinary Tract: Both adrenal glands appear normal. The kidneys and ureters appear normal. No hydronephrosis.  Stomach/Bowel: No evidence of bowel wall  thickening, distention or surrounding inflammatory change. Vascular/Lymphatic: There are no enlarged abdominal lymph nodes. No significant vascular findings. The common hepatic and splenic arteries arise separately from the abdominal aorta. Other: No ascites or focal extraluminal fluid collection. Musculoskeletal: No acute or significant osseous findings. IMPRESSION: 1. No biliary dilatation or evidence of choledocholithiasis post cholecystectomy. 2. Mild peripancreatic inflammatory changes consistent with mild uncomplicated pancreatitis. Electronically Signed   By: Richardean Sale M.D.   On: 12/23/2018 08:26   Mr Abdomen With Mrcp W Contrast  Result Date: 12/23/2018 CLINICAL DATA:  Recurrent mid epigastric pain over the last week. Nausea and vomiting. Elevated liver function studies. Previous cholecystectomy. EXAM: MRI ABDOMEN WITH CONTRAST (WITH MRCP) TECHNIQUE: Multiplanar multisequence MR imaging of the abdomen was performed following the administration of intravenous contrast. Heavily T2-weighted images of the biliary and pancreatic ducts were obtained, and three-dimensional MRCP images were rendered by post processing. CONTRAST:  6 cc Gadavist. COMPARISON:  Abdominopelvic CT 12/21/2018 and ultrasound 12/16/2018 FINDINGS: Lower chest: Mild atelectasis at both lung bases. The visualized lower chest otherwise appears unremarkable. Hepatobiliary: The liver is normal in signal without focal lesion or abnormal enhancement. There is no intra or extrahepatic biliary dilatation post cholecystectomy. No evidence of choledocholithiasis. Pancreas: The pancreas is homogeneous in signal and enhancement. A small amount of peripancreatic inflammation is again noted without focal fluid collection. There is no pancreatic ductal dilatation. Spleen: Normal in size without focal abnormality. Adrenals/Urinary Tract: Both adrenal glands appear normal. The kidneys and ureters appear normal. No hydronephrosis. Stomach/Bowel: No  evidence of bowel wall thickening, distention or surrounding inflammatory change. Vascular/Lymphatic: There are no enlarged abdominal lymph nodes. No significant vascular findings. The common hepatic and splenic arteries arise separately from the abdominal aorta. Other: No ascites or focal extraluminal fluid collection. Musculoskeletal: No acute or significant osseous findings. IMPRESSION: 1. No biliary dilatation or evidence of choledocholithiasis post cholecystectomy. 2. Mild peripancreatic inflammatory changes consistent with mild uncomplicated pancreatitis. Electronically Signed   By: Richardean Sale M.D.   On: 12/23/2018 08:26   ASSESSMENT AND PLAN:    1. Acute pancreatitis - Lipase was severely elevated, greater than 10,000.  Lipase level already trending down to 83. Abdominal pains significantly improved.  Denies any nausea vomiting.  Tolerating clear liquid diet.  Diet advanced to full liquid diet today.  Anticipate advancing to regular consistency diet in a.m.   Patient adequately hydrated with Ringer's lactate at 250 cc an hour over the last 24 hours.  Decrease rate to 150 cc an hour since patient tolerating p.o.  Patient noted to have elevated liver enzymes.  Liver enzymes trending down.  Patient had MRCP done with no biliary dilatation.  No evidence of cholelithiasis.  Patient status post cholecystectomy.  Evidence of mild peripancreatic inflammatory changes consistent with mild uncomplicated pancreatitis.  Triglyceride level of 90.  Patient denies history of alcohol use. Patient seen by gastroenterologist.  Appreciate input. Work-up for autoimmune pancreatitis with IgG and hepatitis work-up also requested.  Normal albumin and INR as such no evidence of liver failure.  Avoid hepatotoxic drugs.  2.  Elevated liver enzymes; trending down  being evaluated as mentioned above by gastroenterology service. Follow-up on LFTs in a.m.  3.  Dehydration Getting adequate IV fluid hydration  4.  HLD  (hyperlipidemia) - Lipid panel done with LDL of 87.  Lifestyle modifications  DVT prophylaxis; Lovenox  Disposition; anticipate discharge in a.m. if patient tolerating regular consistency diet  All the  records are reviewed and case discussed with Care Management/Social Worker. Management plans discussed with the patient, family and they are in agreement.  CODE STATUS: Full Code  TOTAL TIME TAKING CARE OF THIS PATIENT: 32 okay minutes.   More than 50% of the time was spent in counseling/coordination of care: YES  Novah Nessel M.D on 12/23/2018 at 2:32 PM  Between 7am to 6pm - Pager - [phone]  After 6pm go to [email or web address] - Proofreader  [facility]  Office  [phone]  CC: Primary care physician; Glendon Axe, MD  Note: This dictation was prepared with Dragon dictation along with smaller phrase technology. Any transcriptional errors that result from this process are unintentional.

## 2018-12-24 ENCOUNTER — Inpatient Hospital Stay: Payer: BLUE CROSS/BLUE SHIELD

## 2018-12-24 LAB — COMPREHENSIVE METABOLIC PANEL
ALT: 542 U/L — ABNORMAL HIGH (ref 0–44)
AST: 108 U/L — ABNORMAL HIGH (ref 15–41)
Albumin: 2.8 g/dL — ABNORMAL LOW (ref 3.5–5.0)
Alkaline Phosphatase: 173 U/L — ABNORMAL HIGH (ref 38–126)
Anion gap: 3 — ABNORMAL LOW (ref 5–15)
BUN: 6 mg/dL (ref 6–20)
CO2: 28 mmol/L (ref 22–32)
Calcium: 8.3 mg/dL — ABNORMAL LOW (ref 8.9–10.3)
Chloride: 110 mmol/L (ref 98–111)
Creatinine, Ser: 0.51 mg/dL (ref 0.44–1.00)
GFR calc Af Amer: >60 mL/min (ref >60)
GFR calc non Af Amer: >60 mL/min (ref >60)
Glucose, Bld: 93 mg/dL (ref 70–99)
POTASSIUM: 3.5 mmol/L (ref 3.5–5.1)
Sodium: 141 mmol/L (ref 135–145)
TOTAL PROTEIN: 5.6 g/dL — AB (ref 6.5–8.1)
Total Bilirubin: 0.9 mg/dL (ref 0.3–1.2)

## 2018-12-24 LAB — CBC
HCT: 30.2 % — ABNORMAL LOW (ref 36.0–46.0)
HEMOGLOBIN: 9.7 g/dL — AB (ref 12.0–15.0)
MCH: 29.6 pg (ref 26.0–34.0)
MCHC: 32.1 g/dL (ref 30.0–36.0)
MCV: 92.1 fL (ref 80.0–100.0)
Platelets: 196 10*3/uL (ref 150–400)
RBC: 3.28 MIL/uL — ABNORMAL LOW (ref 3.87–5.11)
RDW: 13.7 % (ref 11.5–15.5)
WBC: 7.1 10*3/uL (ref 4.0–10.5)
nRBC: 0 % (ref 0.0–0.2)

## 2018-12-24 LAB — HEPATITIS C VRS RNA DETECT BY PCR-QUAL: Hepatitis C Vrs RNA by PCR-Qual: NEGATIVE

## 2018-12-24 LAB — MAGNESIUM: MAGNESIUM: 2 mg/dL (ref 1.7–2.4)

## 2018-12-24 MED ORDER — MELOXICAM 15 MG PO TABS: 15.0000 mg | ORAL_TABLET | Freq: Every day | ORAL | Status: AC

## 2018-12-24 NOTE — Progress Notes (Signed)
Dc instructions given with help of interpreter on a stick.  Extensive teaching about diet and follow up given. Pt has lots of questions diagnosis.  Dr Marius Ditch rounded on pt during the dc process.  She was able to explain more of pt's diagnosis and follow up.  Dr Marius Ditch wanted me to give pt information on prediabetes diet and to have pt follow up with GI in 4 weeks.  Work note given to pt.  Dc home via w/c

## 2018-12-24 NOTE — Discharge Summary (Signed)
[facility] NAME: Madison Harvey    MR#:  [id number]  DATE OF BIRTH:  Nov 27, 1961  DATE OF ADMISSION:  12/21/2018 ADMITTING PHYSICIAN: Lance Coon, MD  DATE OF DISCHARGE: 12/24/2018  PRIMARY CARE PHYSICIAN: Glendon Axe, MD    ADMISSION DIAGNOSIS:  Abdominal pain, acute, epigastric [R10.13] Acute pancreatitis, unspecified complication status, unspecified pancreatitis type [K85.90]  DISCHARGE DIAGNOSIS:  Principal Problem:   Acute pancreatitis Active Problems:   Transaminitis   Dehydration   HLD (hyperlipidemia)   SECONDARY DIAGNOSIS:   Past Medical History:  Diagnosis Date  . Hyperlipidemia     HOSPITAL COURSE:   1.  Acute pancreatitis.  This has resolved very quickly.  The patient's gallbladder has been removed in the past.  The patient does not drink alcohol.  Triglycerides normal.  She recently started glucosamine/chondroitin supplementation.  She was advised to stop this medication.  Patient was given solid food to try to eat.  Patient having no pain in the abdomen upon discharge home. 2.  Transaminitis.  Liver function tests very elevated when she came in.  AST 2347 and ALT 1553.  AST has improved to 108 and ALT 542.  Recommend checking liver function test as outpatient.  Patient advised to stop glucosamine/chondroitin supplementation.  Previous hepatitis a infection.  Previous hepatitis B infection.  Hepatitis C antibody negative.  HIV test negative. 3.  Left thigh pain. ultrasound of the left lower extremity negative for DVT.  Patient does have some point tenderness.  The patient was prescribed ibuprofen while here in the hospital.  I advised ice for the next 24 to 48 hours and then heat after that.  I did prescribe Mobic as outpatient. 4.  Anemia.  Follow-up as outpatient.  DISCHARGE CONDITIONS:   Satisfactory  CONSULTS OBTAINED:  Treatment Team:  Virgel Manifold, MD  DRUG ALLERGIES:  No Known  Allergies  DISCHARGE MEDICATIONS:   Allergies as of 12/24/2018   No Known Allergies     Medication List    TAKE these medications   meloxicam 15 MG tablet Commonly known as:  MOBIC Take 1 tablet (15 mg total) by mouth daily for 30 days. Notes to patient:  Take with food        DISCHARGE INSTRUCTIONS:   Follow-up PMD 5 days  If you experience worsening of your admission symptoms, develop shortness of breath, life threatening emergency, suicidal or homicidal thoughts you must seek medical attention immediately by calling 911 or calling your MD immediately  if symptoms less severe.  You Must read complete instructions/literature along with all the possible adverse reactions/side effects for all the Medicines you take and that have been prescribed to you. Take any new Medicines after you have completely understood and accept all the possible adverse reactions/side effects.   Please note  You were cared for by a hospitalist during your hospital stay. If you have any questions about your discharge medications or the care you received while you were in the hospital after you are discharged, you can call the unit and asked to speak with the hospitalist on call if the hospitalist that took care of you is not available. Once you are discharged, your primary care physician will handle any further medical issues. Please note that NO REFILLS for any discharge medications will be authorized once you are discharged, as it is imperative that you return to your primary care physician (or establish a relationship with a primary care physician if you  do not have one) for your aftercare needs so that they can reassess your need for medications and monitor your lab values.    Today   CHIEF COMPLAINT:   Chief Complaint  Patient presents with  . Abdominal Pain    HISTORY OF PRESENT ILLNESS:  Madison Harvey  is a 58 y.o. female came in with abdominal pain nausea vomiting.   VITAL SIGNS:  Blood  pressure (!) 139/48, pulse (!) 57, temperature 98.5 F (36.9 C), temperature source Oral, resp. rate 18, height 5\' 1"  (1.549 m), weight 61.7 kg, SpO2 98 %.    PHYSICAL EXAMINATION:  GENERAL:  58 y.o.-year-old patient lying in the bed with no acute distress.  EYES: Pupils equal, round, reactive to light and accommodation. No scleral icterus. Extraocular muscles intact.  HEENT: Head atraumatic, normocephalic. Oropharynx and nasopharynx clear.  NECK:  Supple, no jugular venous distention. No thyroid enlargement, no tenderness.  LUNGS: Normal breath sounds bilaterally, no wheezing, rales,rhonchi or crepitation. No use of accessory muscles of respiration.  CARDIOVASCULAR: S1, S2 normal. No murmurs, rubs, or gallops.  ABDOMEN: Soft, non-tender, non-distended. Bowel sounds present. No organomegaly or mass.  EXTREMITIES: No pedal edema, cyanosis, or clubbing.  Left thigh tenderness to palpation. NEUROLOGIC: Cranial nerves II through XII are intact. Muscle strength 5/5 in all extremities. Sensation intact. Gait not checked.  PSYCHIATRIC: The patient is alert and oriented x 3.  SKIN: No obvious rash, lesion, or ulcer.   DATA REVIEW:   CBC Recent Labs  Lab 12/24/18 0444  WBC 7.1  HGB 9.7*  HCT 30.2*  PLT 196    Chemistries  Recent Labs  Lab 12/24/18 0444  NA 141  K 3.5  CL 110  CO2 28  GLUCOSE 93  BUN 6  CREATININE 0.51  CALCIUM 8.3*  MG 2.0  AST 108*  ALT 542*  ALKPHOS 173*  BILITOT 0.9     RADIOLOGY:  Mr 3d Recon At Scanner  Result Date: 12/23/2018 CLINICAL DATA:  Recurrent mid epigastric pain over the last week. Nausea and vomiting. Elevated liver function studies. Previous cholecystectomy. EXAM: MRI ABDOMEN WITH CONTRAST (WITH MRCP) TECHNIQUE: Multiplanar multisequence MR imaging of the abdomen was performed following the administration of intravenous contrast. Heavily T2-weighted images of the biliary and pancreatic ducts were obtained, and three-dimensional MRCP images  were rendered by post processing. CONTRAST:  6 cc Gadavist. COMPARISON:  Abdominopelvic CT 12/21/2018 and ultrasound 12/16/2018 FINDINGS: Lower chest: Mild atelectasis at both lung bases. The visualized lower chest otherwise appears unremarkable. Hepatobiliary: The liver is normal in signal without focal lesion or abnormal enhancement. There is no intra or extrahepatic biliary dilatation post cholecystectomy. No evidence of choledocholithiasis. Pancreas: The pancreas is homogeneous in signal and enhancement. A small amount of peripancreatic inflammation is again noted without focal fluid collection. There is no pancreatic ductal dilatation. Spleen: Normal in size without focal abnormality. Adrenals/Urinary Tract: Both adrenal glands appear normal. The kidneys and ureters appear normal. No hydronephrosis. Stomach/Bowel: No evidence of bowel wall thickening, distention or surrounding inflammatory change. Vascular/Lymphatic: There are no enlarged abdominal lymph nodes. No significant vascular findings. The common hepatic and splenic arteries arise separately from the abdominal aorta. Other: No ascites or focal extraluminal fluid collection. Musculoskeletal: No acute or significant osseous findings. IMPRESSION: 1. No biliary dilatation or evidence of choledocholithiasis post cholecystectomy. 2. Mild peripancreatic inflammatory changes consistent with mild uncomplicated pancreatitis. Electronically Signed   By: Richardean Sale M.D.   On: 12/23/2018 08:26   US Venous Img Lower  Unilateral Left  Result Date: 12/24/2018 CLINICAL DATA:  Left thigh pain. EXAM: LEFT LOWER EXTREMITY VENOUS DOPPLER ULTRASOUND TECHNIQUE: Gray-scale sonography with graded compression, as well as color Doppler and duplex ultrasound were performed to evaluate the lower extremity deep venous systems from the level of the common femoral vein and including the common femoral, femoral, profunda femoral, popliteal and calf veins including the  posterior tibial, peroneal and gastrocnemius veins when visible. The superficial great saphenous vein was also interrogated. Spectral Doppler was utilized to evaluate flow at rest and with distal augmentation maneuvers in the common femoral, femoral and popliteal veins. COMPARISON:  None. FINDINGS: Contralateral Common Femoral Vein: Respiratory phasicity is normal and symmetric with the symptomatic side. No evidence of thrombus. Normal compressibility. Common Femoral Vein: No evidence of thrombus. Normal compressibility, respiratory phasicity and response to augmentation. Saphenofemoral Junction: No evidence of thrombus. Normal compressibility and flow on color Doppler imaging. Profunda Femoral Vein: No evidence of thrombus. Normal compressibility and flow on color Doppler imaging. Femoral Vein: No evidence of thrombus. Normal compressibility, respiratory phasicity and response to augmentation. Popliteal Vein: No evidence of thrombus. Normal compressibility, respiratory phasicity and response to augmentation. Calf Veins: No evidence of thrombus. Normal compressibility and flow on color Doppler imaging. Superficial Great Saphenous Vein: No evidence of thrombus. Normal compressibility. Venous Reflux:  None. Other Findings: No evidence of superficial thrombophlebitis or abnormal fluid collection. Examination was directed towards an area erythema by the patient above the left knee in the distal medial left thigh. In this region, there is an ill-defined area relative hyperechoic tissue in the subcutaneous fat measuring roughly 1.8 x 1.0 x 1.2 cm. This has the appearance focal inflammation without fluid collection and may relate to fat necrosis or some other type of regional inflammatory process. IMPRESSION: 1. No evidence of left lower extremity deep venous thrombosis. 2. Area of relative increased echogenicity in the subcutaneous fat of the distal left medial thigh just above the knee where there is apparently an area  of erythema clinically. This may relate to fat necrosis or other local inflammatory process. This does not have the appearance of focal fluid or abscess. Electronically Signed   By: Aletta Edouard M.D.   On: 12/24/2018 10:57   Mr Abdomen With Mrcp W Contrast  Result Date: 12/23/2018 CLINICAL DATA:  Recurrent mid epigastric pain over the last week. Nausea and vomiting. Elevated liver function studies. Previous cholecystectomy. EXAM: MRI ABDOMEN WITH CONTRAST (WITH MRCP) TECHNIQUE: Multiplanar multisequence MR imaging of the abdomen was performed following the administration of intravenous contrast. Heavily T2-weighted images of the biliary and pancreatic ducts were obtained, and three-dimensional MRCP images were rendered by post processing. CONTRAST:  6 cc Gadavist. COMPARISON:  Abdominopelvic CT 12/21/2018 and ultrasound 12/16/2018 FINDINGS: Lower chest: Mild atelectasis at both lung bases. The visualized lower chest otherwise appears unremarkable. Hepatobiliary: The liver is normal in signal without focal lesion or abnormal enhancement. There is no intra or extrahepatic biliary dilatation post cholecystectomy. No evidence of choledocholithiasis. Pancreas: The pancreas is homogeneous in signal and enhancement. A small amount of peripancreatic inflammation is again noted without focal fluid collection. There is no pancreatic ductal dilatation. Spleen: Normal in size without focal abnormality. Adrenals/Urinary Tract: Both adrenal glands appear normal. The kidneys and ureters appear normal. No hydronephrosis. Stomach/Bowel: No evidence of bowel wall thickening, distention or surrounding inflammatory change. Vascular/Lymphatic: There are no enlarged abdominal lymph nodes. No significant vascular findings. The common hepatic and splenic arteries arise separately from the abdominal aorta. Other:  No ascites or focal extraluminal fluid collection. Musculoskeletal: No acute or significant osseous findings. IMPRESSION:  1. No biliary dilatation or evidence of choledocholithiasis post cholecystectomy. 2. Mild peripancreatic inflammatory changes consistent with mild uncomplicated pancreatitis. Electronically Signed   By: Richardean Sale M.D.   On: 12/23/2018 08:26     Management plans discussed with the patient, family and they are in agreement.  CODE STATUS:     Code Status Orders  (From admission, onward)         Start     Ordered   12/21/18 2345  Full code  Continuous     12/21/18 2344        Code Status History    This patient has a current code status but no historical code status.      TOTAL TIME TAKING CARE OF THIS PATIENT: 37 minutes.    Loletha Grayer M.D on 12/24/2018 at 1:43 PM  Between 7am to 6pm - Pager - [phone]  After 6pm go to [email or web address] - password [email or web address]  [phone]  CC: Primary care physician; Glendon Axe, MD

## 2018-12-24 NOTE — Discharge Instructions (Signed)
Stop all supplements except for mutlivitamin

## 2018-12-24 NOTE — Progress Notes (Signed)
Cephas Darby, MD [address]  [place]  [place], [place] [address]  Main: [phone]  Fax: [phone] Pager: [phone]   Subjective: Patient is ready to be discharged home today.  She has been tolerating diet well.  She denies any GI symptoms.   Objective: Vital signs in last 24 hours: Vitals:   12/23/18 1016 12/23/18 2319 12/24/18 0720 12/24/18 1333  BP: 125/62 (!) 104/53 (!) 108/53 (!) 139/48  Pulse: (!) 56 (!) 57 (!) 50 (!) 57  Resp: 16 18    Temp: 98.3 F (36.8 C) 98.3 F (36.8 C) 99.3 F (37.4 C) 98.5 F (36.9 C)  TempSrc: Oral Oral Oral Oral  SpO2: 95% 96% 93% 98%  Weight:      Height:       Weight change:   Intake/Output Summary (Last 24 hours) at 12/24/2018 1553 Last data filed at 12/24/2018 1300 Gross per 24 hour  Intake 480 ml  Output 301 ml  Net 179 ml     Exam: Heart:: Regular rate and rhythm or S1S2 present Lungs: normal and clear to auscultation Abdomen: soft, nontender, normal bowel sounds   Lab Results: CBC Latest Ref Rng & Units 12/24/2018 12/23/2018 12/22/2018  WBC 4.0 - 10.5 K/uL 7.1 8.0 12.4(H)  Hemoglobin 12.0 - 15.0 g/dL 9.7(L) 9.4(L) 10.9(L)  Hematocrit 36.0 - 46.0 % 30.2(L) 29.8(L) 33.9(L)  Platelets 150 - 400 K/uL 196 171 230   CMP Latest Ref Rng & Units 12/24/2018 12/23/2018 12/22/2018  Glucose 70 - 99 mg/dL 93 99 135(H)  BUN 6 - 20 mg/dL 6 7 11   Creatinine 0.44 - 1.00 mg/dL 0.51 0.45 0.45  Sodium 135 - 145 mmol/L 141 143 140  Potassium 3.5 - 5.1 mmol/L 3.5 3.4(L) 3.5  Chloride 98 - 111 mmol/L 110 111 109  CO2 22 - 32 mmol/L 28 29 25   Calcium 8.9 - 10.3 mg/dL 8.3(L) 8.0(L) 7.9(L)  Total Protein 6.5 - 8.1 g/dL 5.6(L) 5.0(L) 6.2(L)  Total Bilirubin 0.3 - 1.2 mg/dL 0.9 0.8 1.5(H)  Alkaline Phos 38 - 126 U/L 173(H) 192(H) 295(H)  AST 15 - 41 U/L 108(H) 261(H) 1,362(H)  ALT 0 - 44 U/L 542(H) 753(H) BLOOD   Micro Results: No results found for this or any previous visit (from the past 240  hour(s)). Studies/Results: Mr 3d Recon At Scanner  Result Date: 12/23/2018 CLINICAL DATA:  Recurrent mid epigastric pain over the last week. Nausea and vomiting. Elevated liver function studies. Previous cholecystectomy. EXAM: MRI ABDOMEN WITH CONTRAST (WITH MRCP) TECHNIQUE: Multiplanar multisequence MR imaging of the abdomen was performed following the administration of intravenous contrast. Heavily T2-weighted images of the biliary and pancreatic ducts were obtained, and three-dimensional MRCP images were rendered by post processing. CONTRAST:  6 cc Gadavist. COMPARISON:  Abdominopelvic CT 12/21/2018 and ultrasound 12/16/2018 FINDINGS: Lower chest: Mild atelectasis at both lung bases. The visualized lower chest otherwise appears unremarkable. Hepatobiliary: The liver is normal in signal without focal lesion or abnormal enhancement. There is no intra or extrahepatic biliary dilatation post cholecystectomy. No evidence of choledocholithiasis. Pancreas: The pancreas is homogeneous in signal and enhancement. A small amount of peripancreatic inflammation is again noted without focal fluid collection. There is no pancreatic ductal dilatation. Spleen: Normal in size without focal abnormality. Adrenals/Urinary Tract: Both adrenal glands appear normal. The kidneys and ureters appear normal. No hydronephrosis. Stomach/Bowel: No evidence of bowel wall thickening, distention or surrounding inflammatory change. Vascular/Lymphatic: There are no enlarged abdominal lymph nodes. No significant vascular findings. The  common hepatic and splenic arteries arise separately from the abdominal aorta. Other: No ascites or focal extraluminal fluid collection. Musculoskeletal: No acute or significant osseous findings. IMPRESSION: 1. No biliary dilatation or evidence of choledocholithiasis post cholecystectomy. 2. Mild peripancreatic inflammatory changes consistent with mild uncomplicated pancreatitis. Electronically Signed   By: Richardean Sale M.D.   On: 12/23/2018 08:26   US Venous Img Lower Unilateral Left  Result Date: 12/24/2018 CLINICAL DATA:  Left thigh pain. EXAM: LEFT LOWER EXTREMITY VENOUS DOPPLER ULTRASOUND TECHNIQUE: Gray-scale sonography with graded compression, as well as color Doppler and duplex ultrasound were performed to evaluate the lower extremity deep venous systems from the level of the common femoral vein and including the common femoral, femoral, profunda femoral, popliteal and calf veins including the posterior tibial, peroneal and gastrocnemius veins when visible. The superficial great saphenous vein was also interrogated. Spectral Doppler was utilized to evaluate flow at rest and with distal augmentation maneuvers in the common femoral, femoral and popliteal veins. COMPARISON:  None. FINDINGS: Contralateral Common Femoral Vein: Respiratory phasicity is normal and symmetric with the symptomatic side. No evidence of thrombus. Normal compressibility. Common Femoral Vein: No evidence of thrombus. Normal compressibility, respiratory phasicity and response to augmentation. Saphenofemoral Junction: No evidence of thrombus. Normal compressibility and flow on color Doppler imaging. Profunda Femoral Vein: No evidence of thrombus. Normal compressibility and flow on color Doppler imaging. Femoral Vein: No evidence of thrombus. Normal compressibility, respiratory phasicity and response to augmentation. Popliteal Vein: No evidence of thrombus. Normal compressibility, respiratory phasicity and response to augmentation. Calf Veins: No evidence of thrombus. Normal compressibility and flow on color Doppler imaging. Superficial Great Saphenous Vein: No evidence of thrombus. Normal compressibility. Venous Reflux:  None. Other Findings: No evidence of superficial thrombophlebitis or abnormal fluid collection. Examination was directed towards an area erythema by the patient above the left knee in the distal medial left thigh. In this  region, there is an ill-defined area relative hyperechoic tissue in the subcutaneous fat measuring roughly 1.8 x 1.0 x 1.2 cm. This has the appearance focal inflammation without fluid collection and may relate to fat necrosis or some other type of regional inflammatory process. IMPRESSION: 1. No evidence of left lower extremity deep venous thrombosis. 2. Area of relative increased echogenicity in the subcutaneous fat of the distal left medial thigh just above the knee where there is apparently an area of erythema clinically. This may relate to fat necrosis or other local inflammatory process. This does not have the appearance of focal fluid or abscess. Electronically Signed   By: Aletta Edouard M.D.   On: 12/24/2018 10:57   Mr Abdomen With Mrcp W Contrast  Result Date: 12/23/2018 CLINICAL DATA:  Recurrent mid epigastric pain over the last week. Nausea and vomiting. Elevated liver function studies. Previous cholecystectomy. EXAM: MRI ABDOMEN WITH CONTRAST (WITH MRCP) TECHNIQUE: Multiplanar multisequence MR imaging of the abdomen was performed following the administration of intravenous contrast. Heavily T2-weighted images of the biliary and pancreatic ducts were obtained, and three-dimensional MRCP images were rendered by post processing. CONTRAST:  6 cc Gadavist. COMPARISON:  Abdominopelvic CT 12/21/2018 and ultrasound 12/16/2018 FINDINGS: Lower chest: Mild atelectasis at both lung bases. The visualized lower chest otherwise appears unremarkable. Hepatobiliary: The liver is normal in signal without focal lesion or abnormal enhancement. There is no intra or extrahepatic biliary dilatation post cholecystectomy. No evidence of choledocholithiasis. Pancreas: The pancreas is homogeneous in signal and enhancement. A small amount of peripancreatic inflammation is again noted without focal  fluid collection. There is no pancreatic ductal dilatation. Spleen: Normal in size without focal abnormality. Adrenals/Urinary  Tract: Both adrenal glands appear normal. The kidneys and ureters appear normal. No hydronephrosis. Stomach/Bowel: No evidence of bowel wall thickening, distention or surrounding inflammatory change. Vascular/Lymphatic: There are no enlarged abdominal lymph nodes. No significant vascular findings. The common hepatic and splenic arteries arise separately from the abdominal aorta. Other: No ascites or focal extraluminal fluid collection. Musculoskeletal: No acute or significant osseous findings. IMPRESSION: 1. No biliary dilatation or evidence of choledocholithiasis post cholecystectomy. 2. Mild peripancreatic inflammatory changes consistent with mild uncomplicated pancreatitis. Electronically Signed   By: Richardean Sale M.D.   On: 12/23/2018 08:26   Medications:  I have reviewed the patient's current medications. Prior to Admission:  No medications prior to admission.   Scheduled: . enoxaparin (LOVENOX) injection  40 mg Subcutaneous Q24H   Continuous:  ZSM:OLMBEMLJQ, morphine injection, ondansetron **OR** ondansetron (ZOFRAN) IV, ondansetron, oxyCODONE Scheduled Meds: . enoxaparin (LOVENOX) injection  40 mg Subcutaneous Q24H   Continuous Infusions: PRN Meds:.ibuprofen, morphine injection, ondansetron **OR** ondansetron (ZOFRAN) IV, ondansetron, oxyCODONE   Assessment: Principal Problem:   Acute pancreatitis Active Problems:   Transaminitis   Dehydration   HLD (hyperlipidemia)  LFTs continue to improve and clinically asymptomatic Etiology of acute pancreatitis include gallstone pancreatitis or idiopathic  Plan: Discussed with her about lifestyle modification Recommend follow-up with Dr. Bonna Gains in 2 to 4 weeks after discharge Consider repeat CT or EUS as etiology is unclear   LOS: 3 days   Alix Stowers 12/24/2018, 3:53 PM

## 2018-12-24 NOTE — Plan of Care (Signed)
  Problem: Coping: Goal: Level of anxiety will decrease Outcome: Completed/Met   Problem: Elimination: Goal: Will not experience complications related to bowel motility Outcome: Completed/Met   Problem: Pain Managment: Goal: General experience of comfort will improve Outcome: Completed/Met   Problem: Safety: Goal: Ability to remain free from injury will improve Outcome: Completed/Met   Problem: Skin Integrity: Goal: Risk for impaired skin integrity will decrease Outcome: Completed/Met   

## 2018-12-24 NOTE — Progress Notes (Signed)
Patient ID: Madison Harvey    Patient admitted to [facility] on 12/21/2018.  Patient will be discharged on 12/24/2018.  Please excuse the patient's daughter Madison Harvey from work during this timeframe.  Please excuse the patient Madison Harvey from work from 1/22 through 12/27/2018.  Dr. Loletha Grayer [phone]

## 2018-12-26 ENCOUNTER — Telehealth: Payer: Self-pay | Admitting: Gastroenterology

## 2018-12-26 NOTE — Telephone Encounter (Signed)
Left vm for pt to call office and schedule F/U with Dr. Darene Lamer 2-4 weeks

## 2018-12-26 NOTE — Telephone Encounter (Signed)
-----   Message from Virgel Manifold, MD sent at 12/26/2018 10:09 AM EST -----  Please set up clinic appointment with me in 2-4 weeks

## 2018-12-29 ENCOUNTER — Telehealth: Payer: Self-pay

## 2018-12-29 NOTE — Telephone Encounter (Signed)
EMMI Follow-up: Noted on the report that the patient responded no to taking medications.  I talked with Madison Harvey and she said she was taking her medications and doing well.  I let her know there would be second automated call with a different series of questions and to let us know if she had any concerns.

## 2018-12-30 ENCOUNTER — Telehealth: Payer: Self-pay | Admitting: Licensed Clinical Social Worker

## 2018-12-30 NOTE — Telephone Encounter (Signed)
EMMI flagged patient for answering yes to loss of interest in things. Clinical Education officer, museum (CSW) contacted patient via telephone. Patient scored 0 on PHQ-9. Patient reported that she is doing good and has no issues. Patient reported that she is not depressed and is not having thoughts of hurting herself. Patient reported no needs or concerns.   [employer], LCSW [phone]

## 2019-01-25 ENCOUNTER — Ambulatory Visit: Payer: BLUE CROSS/BLUE SHIELD | Admitting: Gastroenterology

## 2019-02-16 ENCOUNTER — Ambulatory Visit: Payer: BLUE CROSS/BLUE SHIELD | Admitting: Gastroenterology

## 2019-02-17 ENCOUNTER — Telehealth: Payer: Self-pay

## 2019-02-17 DIAGNOSIS — R109 Unspecified abdominal pain: Secondary | ICD-10-CM

## 2019-02-17 NOTE — Telephone Encounter (Signed)
Pt notified of need for lab work (placed for [facility]) and that we we call her the results. Has a f/u appt in 2 months.

## 2019-02-17 NOTE — Telephone Encounter (Signed)
-----   Message from Virgel Manifold, MD sent at 02/15/2019  2:07 PM EDT ----- Jackelyn Poling Can you have pt do a CMP please. We can call her with results when they are available since we have to cancel her clinic appointment for tomorrow. Have Tati set up clinic appt in 2 months.

## 2019-04-07 DIAGNOSIS — E049 Nontoxic goiter, unspecified: Secondary | ICD-10-CM | POA: Insufficient documentation

## 2019-04-07 DIAGNOSIS — E039 Hypothyroidism, unspecified: Secondary | ICD-10-CM | POA: Insufficient documentation

## 2019-04-13 ENCOUNTER — Ambulatory Visit (INDEPENDENT_AMBULATORY_CARE_PROVIDER_SITE_OTHER): Payer: BLUE CROSS/BLUE SHIELD | Admitting: Gastroenterology

## 2019-04-13 DIAGNOSIS — K858 Other acute pancreatitis without necrosis or infection: Secondary | ICD-10-CM | POA: Diagnosis not present

## 2019-04-13 DIAGNOSIS — K529 Noninfective gastroenteritis and colitis, unspecified: Secondary | ICD-10-CM

## 2019-04-13 NOTE — Addendum Note (Signed)
Addended by: [name] on: 04/13/2019 12:30 PM   Modules accepted: Orders

## 2019-04-13 NOTE — Progress Notes (Signed)
Madison Antigua, MD [address]  [place]  [place], [place] [address]  Main: [phone]  Fax: [phone]   Primary Care Physician: Glendon Axe, MD  Virtual Visit via Telephone Note  I connected with patient on 04/13/19 at 11:30 AM EDT by telephone and verified that I am speaking with the correct person using two identifiers.   I discussed the limitations, risks, security and privacy concerns of performing an evaluation and management service by telephone and the availability of in person appointments. I also discussed with the patient that there may be a patient responsible charge related to this service. The patient expressed understanding and agreed to proceed.  Location of Patient: Home Location of Provider: Home Persons involved: Patient and provider only   History of Present Illness: Chief Complaint  Patient presents with  . Follow-up Abdominal Pain     HPI: Madison Harvey is a 58 y.o. female who was seen in the hospital in January 2020 due to pancreatitis as evidenced by elevated lipase of over 10,000 and imaging findings.  No prior history of pancreatitis before that.  No alcohol use or smoking.  Patient was taking a glucosamine chondroitin supplement which was new for her 2 weeks prior to presentation.  No other supplements or over-the-counter products.  Previous history of cholecystectomy 6 to 7 years ago.  MRI did not show any choledocholithiasis.  However liver enzymes were elevated on admission and improved subsequently with suspicion of for small sludge or stones that have passed that could have caused the pancreatitis but the etiology remains unclear.  Triglycerides were not elevated and were around 150.  The patient denies abdominal or flank pain, anorexia, nausea or vomiting, dysphagia, change in bowel habits or black or bloody stools or weight loss.  Since this admission patient did go to [facility] ER in February 2020 due to abdominal pain which reported  a normal pancreas.  CT scan during that visit reported. "thickening of the terminal ileum wall, which could be due to contracted state and /or mild ileitis. Mild dilatation of the small bowel loops with mildly increased wall enhancement is also identified. This is not specific but could be seen secondary to diarrheal state and/or mild enteritis."   Patient denies any diarrhea whatsoever and no blood in stool.  Good appetite.  Work-up for elevated liver enzymes during the January admission showed previous infection and natural immunity to hepatitis B.  Normal IgG4 and no evidence of autoimmune hepatitis on lab work.  Patient is also immune to hepatitis A.  Her liver enzymes have since improved and she only has mild elevation to alk phos on recent blood work in May, showing alk phos of 115.  No current outpatient medications on file.   No current facility-administered medications for this visit.     Allergies as of 04/13/2019  . (No Known Allergies)    Review of Systems:    All systems reviewed and negative except where noted in HPI.   Observations/Objective:  Labs: CMP     Component Value Date/Time   NA 141 12/24/2018 0444   K 3.5 12/24/2018 0444   CL 110 12/24/2018 0444   CO2 28 12/24/2018 0444   GLUCOSE 93 12/24/2018 0444   BUN 6 12/24/2018 0444   CREATININE 0.51 12/24/2018 0444   CALCIUM 8.3 (L) 12/24/2018 0444   PROT 5.6 (L) 12/24/2018 0444   ALBUMIN 2.8 (L) 12/24/2018 0444   AST 108 (H) 12/24/2018 0444   ALT 542 (H) 12/24/2018  0444   ALT BLOOD 12/22/2018 0938   ALKPHOS 173 (H) 12/24/2018 0444   BILITOT 0.9 12/24/2018 0444   GFRNONAA >60 12/24/2018 0444   GFRAA >60 12/24/2018 0444   Lab Results  Component Value Date   WBC 7.1 12/24/2018   HGB 9.7 (L) 12/24/2018   HCT 30.2 (L) 12/24/2018   MCV 92.1 12/24/2018   PLT 196 12/24/2018    Imaging Studies: No results found.  Assessment and Plan:   Madison Harvey is a 57 y.o. y/o female with previous admission for  pancreatitis in January 2020 with unknown etiology  Assessment and Plan: Given unknown etiology of pancreatitis in January 2020, will refer patient for EUS to rule out any underlying lesions  Patient does not have any alarm symptoms otherwise  Colonoscopy is up-to-date and was done in June 2016 and 2 polyps were removed and patient states repeat was recommended in 5 years.  This has been reviewed.  After her EUS work-up was done, can consider earlier colonoscopy with terminal ileum intubation given previous CT findings of equalization of the distal small bowel.  However, patient is not having any symptoms to correlate with this finding, and the finding may have been due to infectious enteritis since abdominal pain that she went to the ER with at that time has resolved.  Can obtain MRE to evaluate small bowel more closely  Follow Up Instructions:    I discussed the assessment and treatment plan with the patient. The patient was provided an opportunity to ask questions and all were answered. The patient agreed with the plan and demonstrated an understanding of the instructions.   The patient was advised to call back or seek an in-person evaluation if the symptoms worsen or if the condition fails to improve as anticipated.  Patient spoke English well, understood our entire conversation and was able to repeated back to me appropriately without difficulty.  I provided 30 minutes of non-face-to-face time during this encounter.   Virgel Manifold, MD  Speech recognition software was used to dictate this note.

## 2019-04-17 ENCOUNTER — Other Ambulatory Visit: Payer: Self-pay

## 2019-04-17 DIAGNOSIS — R933 Abnormal findings on diagnostic imaging of other parts of digestive tract: Secondary | ICD-10-CM

## 2019-04-17 NOTE — Addendum Note (Signed)
Addended by: Earl Lagos on: 04/17/2019 11:51 AM   Modules accepted: Orders

## 2019-04-18 ENCOUNTER — Other Ambulatory Visit: Payer: Self-pay

## 2019-04-18 ENCOUNTER — Telehealth: Payer: Self-pay

## 2019-04-18 DIAGNOSIS — K858 Other acute pancreatitis without necrosis or infection: Secondary | ICD-10-CM

## 2019-04-18 NOTE — Telephone Encounter (Signed)
Pt notified of MRI scheduled for Saturday at [facility] at 11:00 am. To check in at the registration desk. Also notified that referral has been sent to [place] for pancreatitis, to contact office in 2 weeks if she has not heard anything. Pt. Voiced understanding.

## 2019-04-19 ENCOUNTER — Ambulatory Visit: Payer: BLUE CROSS/BLUE SHIELD | Admitting: Gastroenterology

## 2019-04-22 ENCOUNTER — Ambulatory Visit
Admission: RE | Admit: 2019-04-22 | Discharge: 2019-04-22 | Disposition: A | Discharge status: Home / Self Care | Payer: BLUE CROSS/BLUE SHIELD | Source: Ambulatory Visit | Attending: Gastroenterology | Admitting: Gastroenterology

## 2019-04-22 ENCOUNTER — Other Ambulatory Visit: Payer: Self-pay

## 2019-04-22 DIAGNOSIS — R933 Abnormal findings on diagnostic imaging of other parts of digestive tract: Secondary | ICD-10-CM

## 2019-04-22 DIAGNOSIS — K529 Noninfective gastroenteritis and colitis, unspecified: Secondary | ICD-10-CM

## 2019-04-22 MED ORDER — GADOBUTROL 1 MMOL/ML IV SOLN
6.0000 mL | Freq: Once | INTRAVENOUS | Status: AC | PRN
  Administered 2019-04-22: 6 mL via INTRAVENOUS

## 2019-04-25 ENCOUNTER — Telehealth: Payer: Self-pay

## 2019-04-25 NOTE — Telephone Encounter (Signed)
Unable to leave message. Message states that we have been placed on the do not call list.

## 2019-04-25 NOTE — Telephone Encounter (Signed)
-----   Message from Virgel Manifold, MD sent at 04/25/2019  1:09 PM EDT ----- Madison Harvey please let patient know, her Mri showed a normal bowel. No significant abnormalities present.

## 2019-05-01 NOTE — Telephone Encounter (Signed)
Pt notified of MRI results

## 2019-07-19 ENCOUNTER — Encounter (INDEPENDENT_AMBULATORY_CARE_PROVIDER_SITE_OTHER): Payer: Self-pay

## 2019-07-19 ENCOUNTER — Ambulatory Visit: Payer: BC Managed Care – PPO | Admitting: Gastroenterology

## 2019-07-19 ENCOUNTER — Other Ambulatory Visit: Payer: Self-pay

## 2019-07-19 VITALS — BP 123/76 | HR 66 | Temp 98.8°F | Wt 133.0 lb

## 2019-07-19 DIAGNOSIS — Z8719 Personal history of other diseases of the digestive system: Secondary | ICD-10-CM | POA: Diagnosis not present

## 2019-07-19 DIAGNOSIS — R748 Abnormal levels of other serum enzymes: Secondary | ICD-10-CM | POA: Diagnosis not present

## 2019-07-20 LAB — HEPATIC FUNCTION PANEL
ALT: 17 IU/L (ref 0–32)
AST: 17 IU/L (ref 0–40)
Albumin: 4.4 g/dL (ref 3.8–4.9)
Alkaline Phosphatase: 121 IU/L — ABNORMAL HIGH (ref 39–117)
Bilirubin Total: 0.5 mg/dL (ref 0.0–1.2)
Bilirubin, Direct: 0.11 mg/dL (ref 0.00–0.40)
Total Protein: 6.5 g/dL (ref 6.0–8.5)

## 2019-07-20 LAB — GAMMA GT: GGT: 10 IU/L (ref 0–60)

## 2019-07-20 NOTE — Progress Notes (Signed)
[name], MD [address]  [place]  [place], [place] [address]  Main: [phone]  Fax: [phone]   Primary Care Physician: Glendon Axe, MD   Chief Complaint  Patient presents with  . Follow-up    Acute pancreatitis    HPI: Madison Harvey is a 58 y.o. female here for follow-up with history of acute pancreatitis in January 2020. The patient denies abdominal or flank pain, anorexia, nausea or vomiting, dysphagia, change in bowel habits or black or bloody stools or weight loss.  Previous history: was seen in the hospital in January 2020 due to pancreatitis as evidenced by elevated lipase of over 10,000 and imaging findings.  No prior history of pancreatitis before that.  No alcohol use or smoking.  Patient was taking a glucosamine chondroitin supplement which was new for her 2 weeks prior to presentation.  No other supplements or over-the-counter products.  Previous history of cholecystectomy 6 to 7 years ago.  MRI did not show any choledocholithiasis.  However liver enzymes were elevated on admission and improved subsequently with suspicion if small sludge or stones that have passed that could have caused the pancreatitis but the etiology remains unclear.  Triglycerides were not elevated and were around 150.  Since this admission patient did go to [facility] ER in February 2020 due to abdominal pain which reported a normal pancreas.  CT scan during that visit reported. "thickening of the terminal ileum wall, which could be due to contracted state and /or mild ileitis. Mild dilatation of the small bowel loops with mildly increased wall enhancement is also identified. This is not specific but could be seen secondary to diarrheal state and/or mild enteritis."   She underwent MRE in May 2020 to evaluate the above CT findings showing abnormalities in the small bowel loops.  The MRE was an unremarkable exam, with no abnormal bowel wall thickening or enhancement.  Work-up  for elevated liver enzymes during the January admission showed previous infection and natural immunity to hepatitis B.  Normal IgG4 and no evidence of autoimmune hepatitis on lab work.  Patient is also immune to hepatitis A.  Her liver enzymes have since improved and she only has mild elevation to alk phos which appears to be chronic.  Most recent liver enzymes were done today, August 2020, which showed mild chronic elevation in alk phos, but normal GGT.  Colonoscopy is up-to-date and was done in June 2016 and 2 polyps were removed and patient states repeat was recommended in 5 years.  This has been reviewed.  Current Outpatient Medications  Medication Sig Dispense Refill  . Omega-3 Fatty Acids (FISH OIL PO) Take 2,400 mg by mouth.     No current facility-administered medications for this visit.     Allergies as of 07/19/2019  . (No Known Allergies)    ROS:  General: Negative for anorexia, weight loss, fever, chills, fatigue, weakness. ENT: Negative for hoarseness, difficulty swallowing , nasal congestion. CV: Negative for chest pain, angina, palpitations, dyspnea on exertion, peripheral edema.  Respiratory: Negative for dyspnea at rest, dyspnea on exertion, cough, sputum, wheezing.  GI: See history of present illness. GU:  Negative for dysuria, hematuria, urinary incontinence, urinary frequency, nocturnal urination.  Endo: Negative for unusual weight change.    Physical Examination:   BP 123/76   Pulse 66   Temp 98.8 F (37.1 C) (Oral)   Wt 133 lb (60.3 kg)   BMI 25.13 kg/m   General: Well-nourished, well-developed in no acute distress.  Eyes: No icterus. Conjunctivae pink. Mouth: Oropharyngeal mucosa moist and pink , no lesions erythema or exudate. Neck: Supple, Trachea midline Abdomen: Bowel sounds are normal, nontender, nondistended, no hepatosplenomegaly or masses, no abdominal bruits or hernia , no rebound or guarding.   Extremities: No lower extremity edema. No  clubbing or deformities. Neuro: Alert and oriented x 3.  Grossly intact. Skin: Warm and dry, no jaundice.   Psych: Alert and cooperative, normal mood and affect.   Labs: CMP     Component Value Date/Time   NA 141 12/24/2018 0444   K 3.5 12/24/2018 0444   CL 110 12/24/2018 0444   CO2 28 12/24/2018 0444   GLUCOSE 93 12/24/2018 0444   BUN 6 12/24/2018 0444   CREATININE 0.51 12/24/2018 0444   CALCIUM 8.3 (L) 12/24/2018 0444   PROT 6.5 07/19/2019 1426   ALBUMIN 4.4 07/19/2019 1426   AST 17 07/19/2019 1426   ALT 17 07/19/2019 1426   ALT BLOOD 12/22/2018 0938   ALKPHOS 121 (H) 07/19/2019 1426   BILITOT 0.5 07/19/2019 1426   GFRNONAA >60 12/24/2018 0444   GFRAA >60 12/24/2018 0444   Lab Results  Component Value Date   WBC 7.1 12/24/2018   HGB 9.7 (L) 12/24/2018   HCT 30.2 (L) 12/24/2018   MCV 92.1 12/24/2018   PLT 196 12/24/2018    Imaging Studies: No results found.  Assessment and Plan:   Madison Harvey is a 58 y.o. y/o female with history of acute pancreatitis in January 2020, with unknown etiology, and elevated liver enzymes at the time, with previous history of cholecystectomy, here for follow-up  No acute or new symptoms  Patient was referred for EUS due to unknown etiology of pancreatitis.  She was referred to [facility], but they have not contacted her to schedule a visit.  We will refer to [facility], to schedule her for an EUS at [facility].  Continue to avoid any over-the-counter products, or supplements  Mild chronic elevation in Alk Phos is not due to liver etiology as evidenced by normal GGT. Further workup and follow up for this to be done wit PCP.     Dr [name]

## 2019-07-24 ENCOUNTER — Telehealth: Payer: Self-pay

## 2019-07-24 NOTE — Telephone Encounter (Signed)
Referral received for EUS. Records sent to Dr. Cephas Darby to review prior to scheduling.

## 2019-07-26 ENCOUNTER — Telehealth: Payer: Self-pay

## 2019-07-26 NOTE — Telephone Encounter (Signed)
Called Madison Harvey and notified her that EUS is pending approval. Once approved we will have it scheduled for her at the first available date. Verbalized understanding.

## 2019-07-31 ENCOUNTER — Telehealth: Payer: Self-pay

## 2019-07-31 NOTE — Telephone Encounter (Signed)
Faxed referral to [facility]. And sent office visit notes and insurance.

## 2019-07-31 NOTE — Telephone Encounter (Signed)
-----   Message from Virgel Manifold, MD sent at 07/31/2019  4:09 PM EDT ----- Refer patient to Pancreas clinic with Dr. Mont Dutton at [facility] for history of pancreatitis of unknown etiology

## 2019-08-14 ENCOUNTER — Other Ambulatory Visit: Payer: Self-pay | Admitting: Internal Medicine

## 2019-08-16 ENCOUNTER — Other Ambulatory Visit: Payer: Self-pay | Admitting: Internal Medicine

## 2019-08-16 DIAGNOSIS — Z1231 Encounter for screening mammogram for malignant neoplasm of breast: Secondary | ICD-10-CM

## 2019-08-17 ENCOUNTER — Telehealth: Payer: Self-pay

## 2019-08-17 NOTE — Telephone Encounter (Signed)
Called [facility] to check on patient referral to the pancreas clinic with Dr. Mont Dutton for history of pancreatitis unknown etiology. They stated the patient is ready to scheduled and they should call her soon to scheduled the appointment. They gave me there number so patient can scheduled her appointment her self. That number is [phone]. Called patient and gave her this number and she will call them and make appointment

## 2019-09-20 ENCOUNTER — Ambulatory Visit
Admission: RE | Admit: 2019-09-20 | Discharge: 2019-09-20 | Disposition: A | Discharge status: Home / Self Care | Payer: BC Managed Care – PPO | Source: Ambulatory Visit | Attending: Internal Medicine | Admitting: Internal Medicine

## 2019-09-20 DIAGNOSIS — Z1231 Encounter for screening mammogram for malignant neoplasm of breast: Secondary | ICD-10-CM

## 2019-10-19 ENCOUNTER — Ambulatory Visit: Payer: BC Managed Care – PPO | Admitting: Gastroenterology

## 2019-10-19 ENCOUNTER — Other Ambulatory Visit: Payer: Self-pay

## 2019-10-19 ENCOUNTER — Encounter (INDEPENDENT_AMBULATORY_CARE_PROVIDER_SITE_OTHER): Payer: Self-pay

## 2019-10-23 ENCOUNTER — Ambulatory Visit (INDEPENDENT_AMBULATORY_CARE_PROVIDER_SITE_OTHER): Payer: BC Managed Care – PPO | Admitting: Gastroenterology

## 2019-10-23 ENCOUNTER — Encounter: Payer: Self-pay | Admitting: Gastroenterology

## 2019-10-23 ENCOUNTER — Other Ambulatory Visit: Payer: Self-pay

## 2019-10-23 VITALS — BP 111/69 | HR 57 | Temp 98.2°F | Ht 62.0 in | Wt 132.5 lb

## 2019-10-23 DIAGNOSIS — Z8719 Personal history of other diseases of the digestive system: Secondary | ICD-10-CM

## 2019-10-25 NOTE — Progress Notes (Signed)
[name], MD [address]  [place]  [place], [place] [address]  Main: [phone]  Fax: [phone]   Primary Care Physician: Baxter Hire, MD   Chief Complaint  Patient presents with  . elevated Alkaline levels    Has some nausea in the morning     HPI: Madison Harvey is a 58 y.o. female here for follow-up of previous episode of pancreatitis which was an isolated episode and has not reoccurred again.  The patient denies abdominal or flank pain, anorexia, nausea or vomiting, dysphagia, change in bowel habits or black or bloody stools or weight loss.  Also had mildly elevated alk phos in the past, with normal GGT, therefore not from GI source  No current outpatient medications on file.   No current facility-administered medications for this visit.     Allergies as of 10/23/2019  . (No Known Allergies)    ROS:  General: Negative for anorexia, weight loss, fever, chills, fatigue, weakness. ENT: Negative for hoarseness, difficulty swallowing , nasal congestion. CV: Negative for chest pain, angina, palpitations, dyspnea on exertion, peripheral edema.  Respiratory: Negative for dyspnea at rest, dyspnea on exertion, cough, sputum, wheezing.  GI: See history of present illness. GU:  Negative for dysuria, hematuria, urinary incontinence, urinary frequency, nocturnal urination.  Endo: Negative for unusual weight change.    Physical Examination:   BP 111/69 (BP Location: Left Arm, Patient Position: Sitting, Cuff Size: Normal)   Pulse (!) 57   Temp 98.2 F (36.8 C) (Oral)   Ht _0  (1.575 m)   Wt 132 lb 8 oz (60.1 kg)   BMI 24.23 kg/m   General: Well-nourished, well-developed in no acute distress.  Eyes: No icterus. Conjunctivae pink. Mouth: Oropharyngeal mucosa moist and pink , no lesions erythema or exudate. Neck: Supple, Trachea midline Abdomen: Bowel sounds are normal, nontender, nondistended, no hepatosplenomegaly or masses, no abdominal  bruits or hernia , no rebound or guarding.   Extremities: No lower extremity edema. No clubbing or deformities. Neuro: Alert and oriented x 3.  Grossly intact. Skin: Warm and dry, no jaundice.   Psych: Alert and cooperative, normal mood and affect.   Labs: CMP     Component Value Date/Time   NA 141 12/24/2018 0444   K 3.5 12/24/2018 0444   CL 110 12/24/2018 0444   CO2 28 12/24/2018 0444   GLUCOSE 93 12/24/2018 0444   BUN 6 12/24/2018 0444   CREATININE 0.51 12/24/2018 0444   CALCIUM 8.3 (L) 12/24/2018 0444   PROT 6.5 07/19/2019 1426   ALBUMIN 4.4 07/19/2019 1426   AST 17 07/19/2019 1426   ALT 17 07/19/2019 1426   ALT BLOOD 12/22/2018 0938   ALKPHOS 121 (H) 07/19/2019 1426   BILITOT 0.5 07/19/2019 1426   GFRNONAA >60 12/24/2018 0444   GFRAA >60 12/24/2018 0444   Lab Results  Component Value Date   WBC 7.1 12/24/2018   HGB 9.7 (L) 12/24/2018   HCT 30.2 (L) 12/24/2018   MCV 92.1 12/24/2018   PLT 196 12/24/2018    Imaging Studies: No results found.  Assessment and Plan:   Madison Harvey is a 58 y.o. y/o female here for follow-up for previous episode of acute pancreatitis in January 2020, with unknown etiology, with elevated liver enzymes at the time, with previous history of cholecystectomy  Her episode of pancreatitis may have been due to passed stones  She was advised to continue to avoid any oral supplements or herbal products  She has  an appointment at [facility] for EUS scheduled in December  Follow-up with PCP as scheduled  Follow-up in our clinic after EUS, or in 6 months  Dr [name]

## 2019-11-15 ENCOUNTER — Encounter: Payer: Self-pay | Admitting: Internal Medicine

## 2020-03-28 IMAGING — CT CT ABD-PELV W/ CM
2 of 5 series · 16 of 46 positions shown, 18 images · IV contrast (APPLIED)
Comparison: Abdominal ultrasound dated 12/16/2018

CLINICAL DATA: 57-year-old female with upper abdominal pain.

EXAM:
CT ABDOMEN AND PELVIS WITH CONTRAST
TECHNIQUE: Multidetector CT imaging of the abdomen and pelvis was performed
using the standard protocol following bolus administration of
intravenous contrast.
CONTRAST:  100mL OMNIPAQUE IOHEXOL 300 MG/ML  SOLN

[Series 2: routine abd/pel with · axial · 0.73mm/px · z∈[+688,+1092]mm · 13 of 93 slices shown, 15 images]
[im 6/93  soft-tissue]
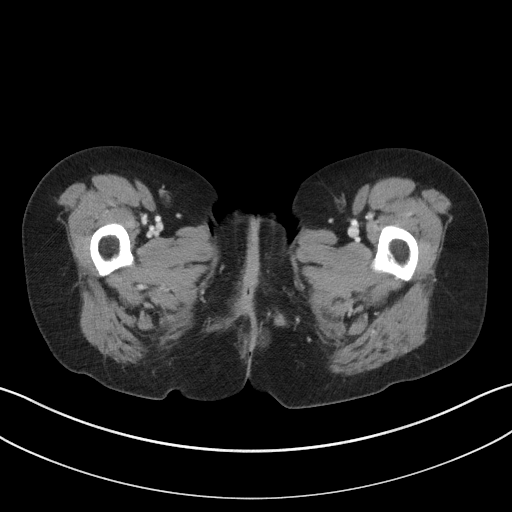
[im 6/93  bone]
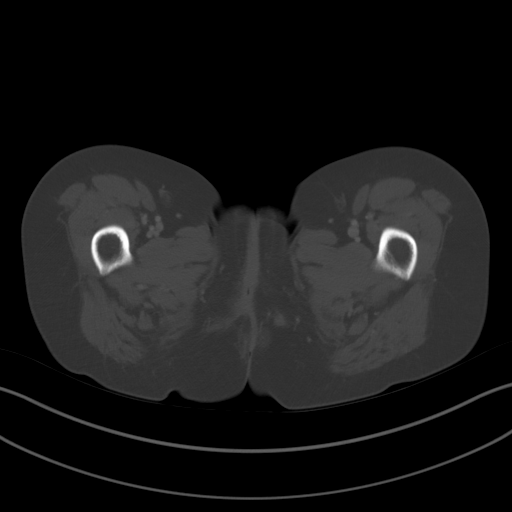
[im 11/93  soft-tissue]
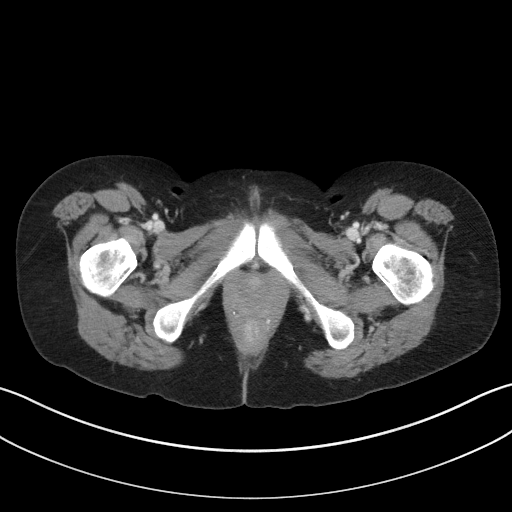
[im 21/93  soft-tissue]
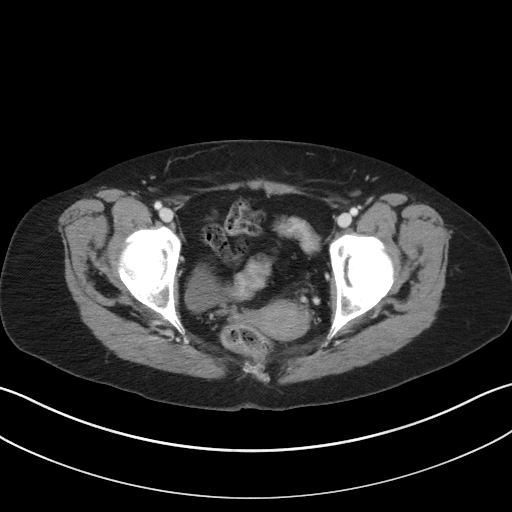
[im 26/93  soft-tissue]
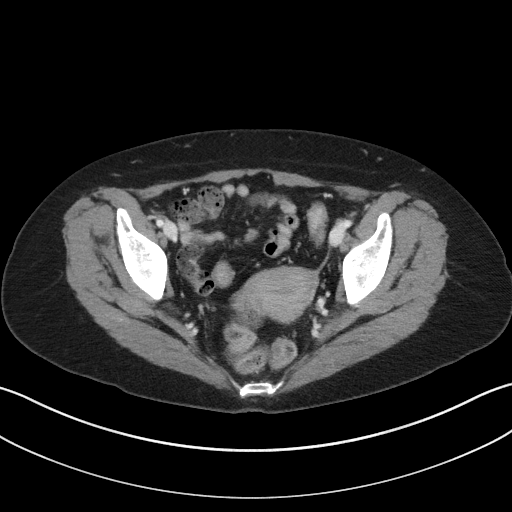
[im 31/93  soft-tissue]
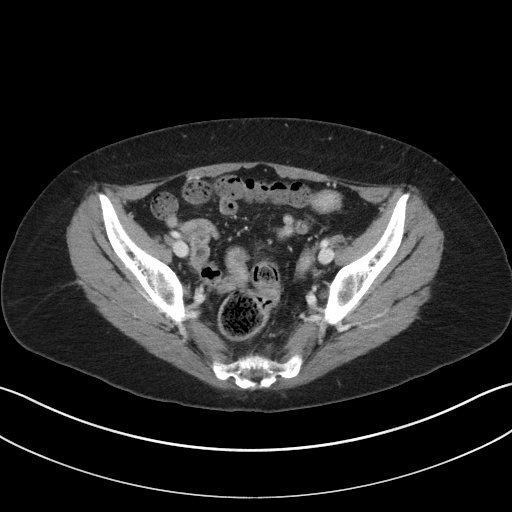
[im 41/93  soft-tissue]
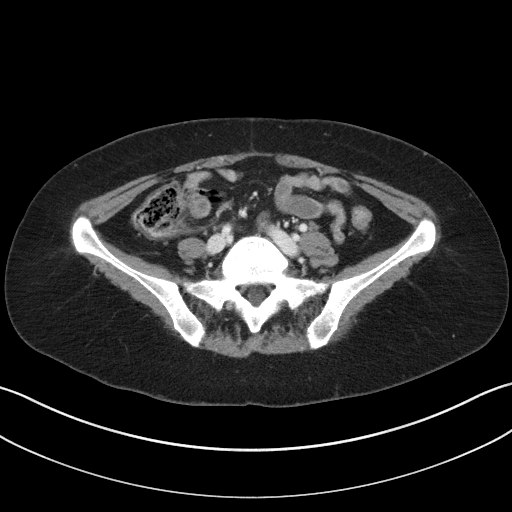
[im 47/93  soft-tissue]
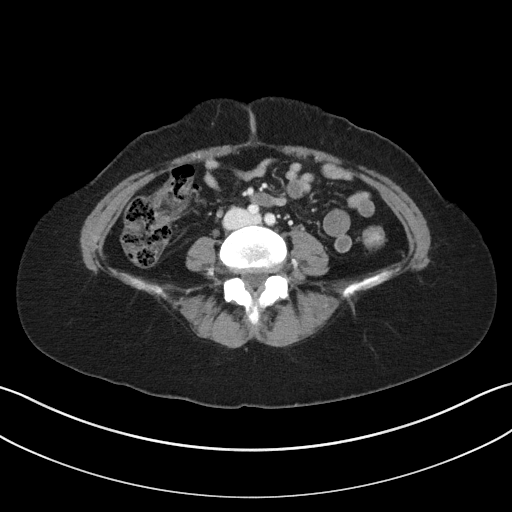
[im 52/93  soft-tissue]
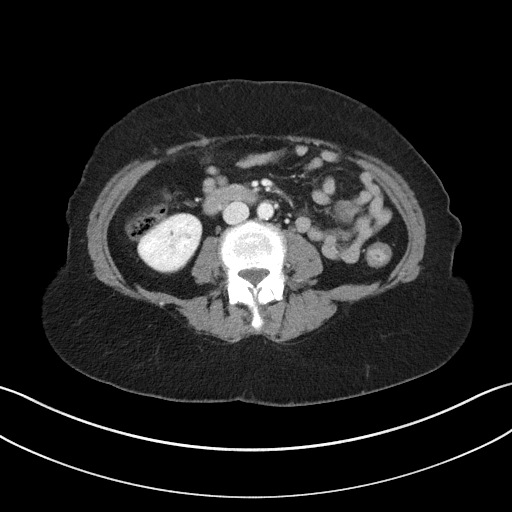
[im 62/93  soft-tissue]
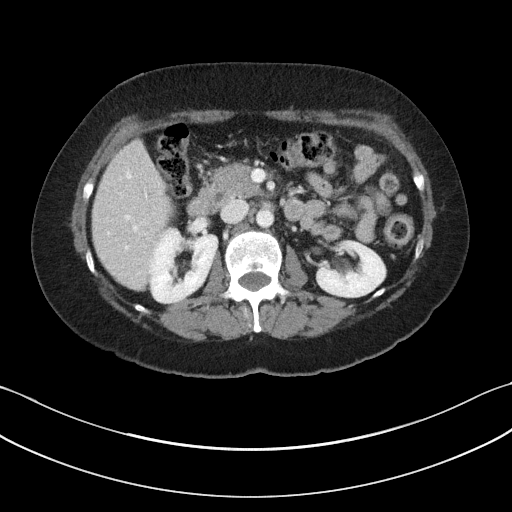
[im 62/93  bone]
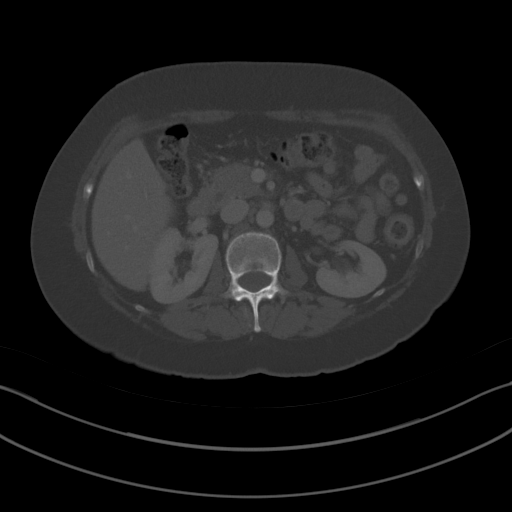
[im 67/93  soft-tissue]
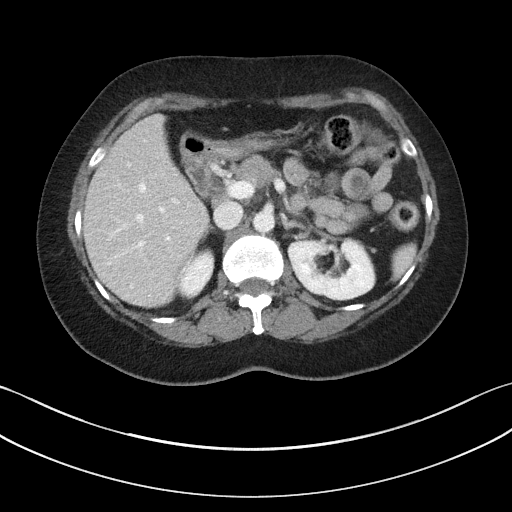
[im 72/93  soft-tissue]
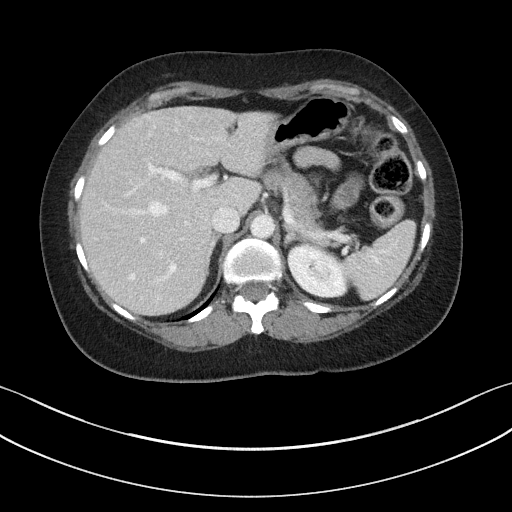
[im 82/93  soft-tissue]
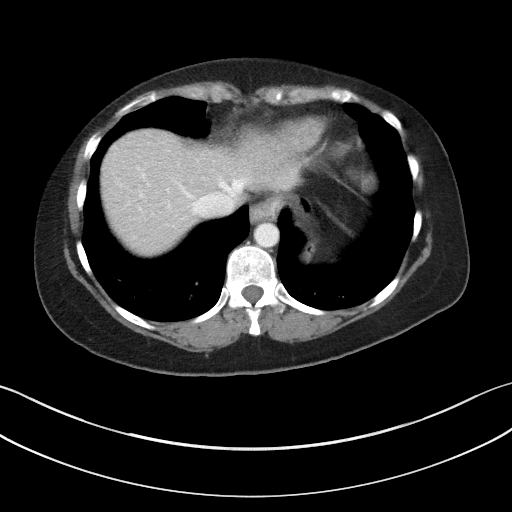
[im 87/93  soft-tissue]
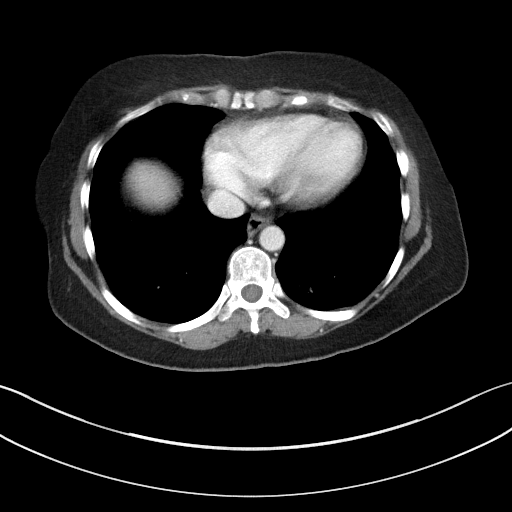

[Series 5: coronal st · coronal · 0.74mm/px · 3 of 78 slices shown]
[im 26/78  soft-tissue]
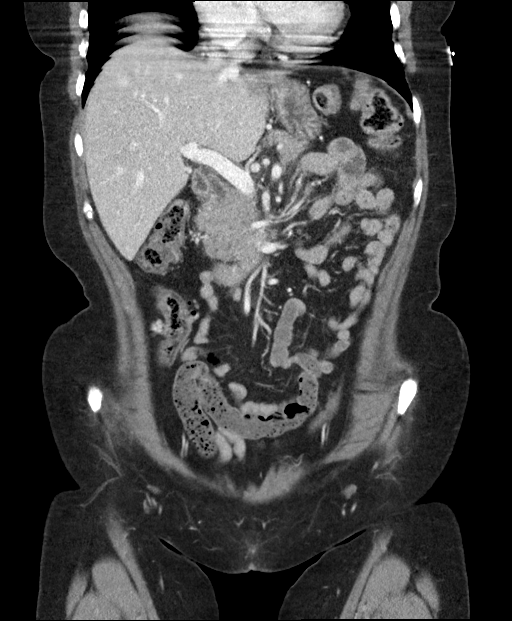
[im 35/78  soft-tissue]
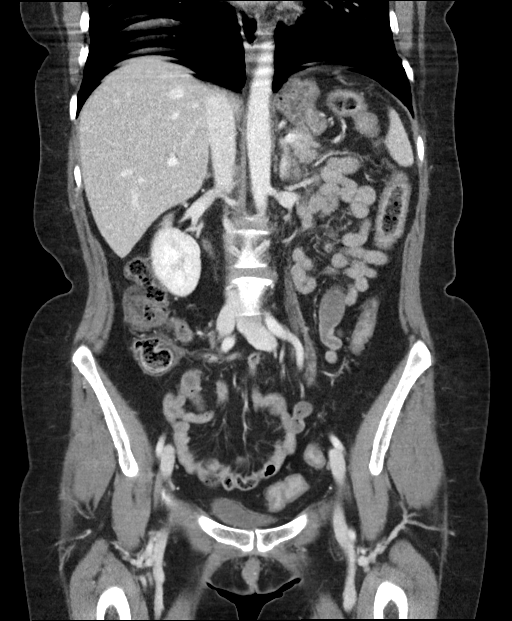
[im 43/78  soft-tissue]
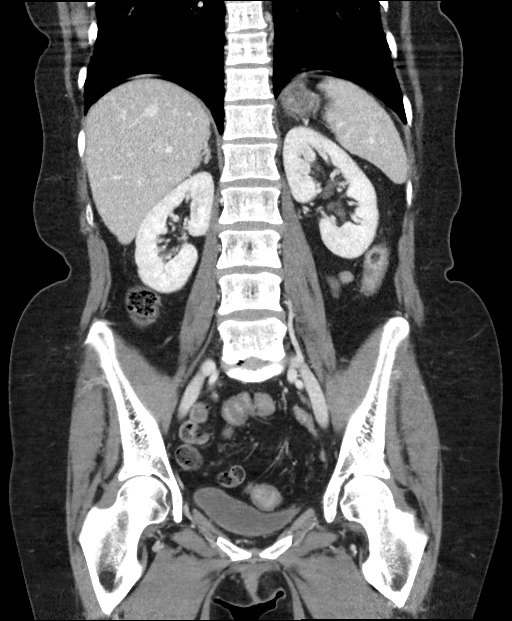

[16 of 46 positions shown; findings below may reference images not displayed]

FINDINGS: Lower chest: Minimal bibasilar dependent atelectatic changes.

No intra-abdominal free air. No significant free fluid.

Hepatobiliary: The liver is unremarkable. No intrahepatic biliary
ductal dilatation. Cholecystectomy.

Pancreas: There is apparent minimal haziness of the pancreas
concerning for early acute pancreatitis. Correlation with pancreatic
enzymes recommended. No fluid collection or abscess.

Spleen: Normal in size without focal abnormality.

Adrenals/Urinary Tract: The adrenal glands are unremarkable. There
is no hydronephrosis on either side. There is symmetric enhancement
and excretion of contrast by both kidneys. The visualized ureters
and urinary bladder appear unremarkable.

Stomach/Bowel: Minimal diffuse thickened appearance of the colon,
likely related to underdistention. Mild colitis is less likely.
Clinical correlation is recommended. There are several colonic
diverticula in the ascending colon and adjacent to the ileocecal
bowel without active inflammatory changes. Normal caliber fecalized
loops of distal small bowel likely represent increased transit time
or small intestine bacterial overgrowth. Clinical correlation is
recommended. There is no bowel obstruction. The appendix is normal.

Vascular/Lymphatic: The abdominal aorta and IVC appear unremarkable.
No portal venous gas. There is no adenopathy.

Reproductive: The uterus and ovaries are grossly unremarkable. No
pelvic mass.

Other: None

Musculoskeletal: No acute or significant osseous findings.
IMPRESSION: 1. Findings likely represent an early/mild acute pancreatitis.
Correlation with pancreatic enzymes recommended.
2. Underdistention of the colon versus less likely mild colitis.
Clinical correlation is recommended. No bowel obstruction. Normal
appendix.

## 2020-10-18 ENCOUNTER — Other Ambulatory Visit: Payer: Self-pay

## 2020-10-21 ENCOUNTER — Other Ambulatory Visit: Payer: Self-pay

## 2020-10-21 ENCOUNTER — Ambulatory Visit (INDEPENDENT_AMBULATORY_CARE_PROVIDER_SITE_OTHER): Payer: Medicaid Other | Admitting: Gastroenterology

## 2020-10-21 ENCOUNTER — Encounter: Payer: Self-pay | Admitting: Gastroenterology

## 2020-10-21 VITALS — BP 123/69 | HR 60 | Temp 98.5°F | Ht 62.0 in | Wt 125.4 lb

## 2020-10-21 DIAGNOSIS — Z8719 Personal history of other diseases of the digestive system: Secondary | ICD-10-CM

## 2020-10-21 NOTE — Patient Instructions (Signed)
Por favor pare de tomar Creon y Duspatalin.  Por favor vaya a ver au doctor de cabezera para hacerle preguntas [employer].

## 2020-10-22 NOTE — Progress Notes (Signed)
[name], MD [address]  [place]  [place], [place] [address]  Main: [phone]  Fax: [phone]   Primary Care Physician: Baxter Hire, MD   Chief Complaint  Patient presents with  . History of Pancreatitis    HPI: Mckinzie Saksa is a 59 y.o. female with 1 isolated episode of pancreatitis in January 2020 here for follow-up. The patient denies abdominal or flank pain, anorexia, nausea or vomiting, dysphagia, change in bowel habits or black or bloody stools or weight loss.  Patient has undergone EUS since her last visit in December 2020, with no concerning findings.  Please see procedure report for details.  Today she brings colonoscopy report from [place] which is in [place] and was done this year.  Colonoscopy was done due to her previous history of polyps in June 2016 with 2 polyps removed and patient was recommended to have repeat in 5 years.  It appears that one small polyp was removed during this procedure and pathology showed mixed adenomatous and hyperplastic polyp.  However, since the report was in Spanish we will need to get this officially translated and scanned into her chart and this has been sent by the staff to the translators.  She brings medications that were prescribed to her in [place].  This includes Creon that was prescribed.  Patient denies any abdominal bloating, diarrhea, reports 1 small bowel movement a day.  She has never had symptoms of chronic pancreatitis.  She is asking if she needs to continue this medication  She also has an abdominal ultrasound report in Spanish that was reported to have a normal pancreas  Previous history: was seen in the hospital in January 2020 due to pancreatitis as evidenced by elevated lipase of over 10,000 and imaging findings.  No prior history of pancreatitis before that.  No alcohol use or smoking.  Patient was taking a glucosamine chondroitin supplement which was new for her 2 weeks prior to  presentation.  No other supplements or over-the-counter products.  Previous history of cholecystectomy 6 to 7 years ago.  MRI did not show any choledocholithiasis.  However liver enzymes were elevated on admission and improved subsequently with suspicion if small sludge or stones that have passed that could have caused the pancreatitis but the etiology remains unclear.  Triglycerides were not elevated and were around 150.  Since this admission patient did go to [facility] ER in February 2020 due to abdominal pain which reported a normal pancreas.  CT scan during that visit reported. "thickening of the terminal ileum wall, which could be due to contracted state and /or mild ileitis. Mild dilatation of the small bowel loops with mildly increased wall enhancement is also identified. This is not specific but could be seen secondary to diarrheal state and/or mild enteritis."   She underwent MRE in May 2020 to evaluate the above CT findings showing abnormalities in the small bowel loops.  The MRE was an unremarkable exam, with no abnormal bowel wall thickening or enhancement.  Work-up for elevated liver enzymes during the January admission showed previous infection and natural immunity to hepatitis B.  Normal IgG4 and no evidence of autoimmune hepatitis on lab work.  Patient is also immune to hepatitis A.  Her liver enzymes have since improved and she only has mild elevation to alk phos which appears to be chronic.  Most recent liver enzymes were done today, August 2020, which showed mild chronic elevation in alk phos, but normal GGT.  Colonoscopy is  up-to-date and was done in June 2016 and 2 polyps were removed and patient states repeat was recommended in 5 years.  This has been reviewed.  Current Outpatient Medications  Medication Sig Dispense Refill  . gemfibrozil (LOPID) 600 MG tablet Take 1 tablet by mouth daily.    Marland Kitchen levothyroxine (SYNTHROID) 25 MCG tablet Take 1 tablet by mouth daily.    . Omega-3  Fatty Acids (FISH OIL) 1000 MG CAPS Take 1 capsule by mouth daily.    . Pancrelipase, Lip-Prot-Amyl, 3000-9500 units CPEP Take 1 capsule by mouth daily.    . vitamin E 180 MG (400 UNITS) capsule Take 1 capsule by mouth daily.     No current facility-administered medications for this visit.    Allergies as of 10/21/2020  . (No Known Allergies)    ROS:  General: Negative for anorexia, weight loss, fever, chills, fatigue, weakness. ENT: Negative for hoarseness, difficulty swallowing , nasal congestion. CV: Negative for chest pain, angina, palpitations, dyspnea on exertion, peripheral edema.  Respiratory: Negative for dyspnea at rest, dyspnea on exertion, cough, sputum, wheezing.  GI: See history of present illness. GU:  Negative for dysuria, hematuria, urinary incontinence, urinary frequency, nocturnal urination.  Endo: Negative for unusual weight change.    Physical Examination:   BP 123/69   Pulse 60   Temp 98.5 F (36.9 C) (Oral)   Ht _0  (1.575 m)   Wt 125 lb 6.4 oz (56.9 kg)   BMI 22.94 kg/m   General: Well-nourished, well-developed in no acute distress.  Eyes: No icterus. Conjunctivae pink. Mouth: Oropharyngeal mucosa moist and pink , no lesions erythema or exudate. Neck: Supple, Trachea midline Abdomen: Bowel sounds are normal, nontender, nondistended, no hepatosplenomegaly or masses, no abdominal bruits or hernia , no rebound or guarding.   Extremities: No lower extremity edema. No clubbing or deformities. Neuro: Alert and oriented x 3.  Grossly intact. Skin: Warm and dry, no jaundice.   Psych: Alert and cooperative, normal mood and affect.   Labs: CMP     Component Value Date/Time   NA 141 12/24/2018 0444   K 3.5 12/24/2018 0444   CL 110 12/24/2018 0444   CO2 28 12/24/2018 0444   GLUCOSE 93 12/24/2018 0444   BUN 6 12/24/2018 0444   CREATININE 0.51 12/24/2018 0444   CALCIUM 8.3 (L) 12/24/2018 0444   PROT 6.5 07/19/2019 1426   ALBUMIN 4.4 07/19/2019 1426     AST 17 07/19/2019 1426   ALT 17 07/19/2019 1426   ALT BLOOD 12/22/2018 0938   ALKPHOS 121 (H) 07/19/2019 1426   BILITOT 0.5 07/19/2019 1426   GFRNONAA >60 12/24/2018 0444   GFRAA >60 12/24/2018 0444   Lab Results  Component Value Date   WBC 7.1 12/24/2018   HGB 9.7 (L) 12/24/2018   HCT 30.2 (L) 12/24/2018   MCV 92.1 12/24/2018   PLT 196 12/24/2018    Imaging Studies: No results found.  Assessment and Plan:   Misaki Sozio is a 59 y.o. y/o female with isolated episode of acute pancreatitis with no concerning findings on EUS here for follow-up  Possibly passed stone was the etiology of her isolated episode of pancreatitis  She does not have any symptoms of acute or chronic pancreatitis  I have asked her to discontinue her Creon and if she has future symptoms of concern to discuss those with [place].  I do not see any reason for her to be on Creon  Records sent for translation and scanning  All  her questions were answered to her satisfaction  Dr [name]

## 2020-11-07 ENCOUNTER — Other Ambulatory Visit: Payer: Self-pay

## 2020-11-12 ENCOUNTER — Other Ambulatory Visit: Payer: Self-pay | Admitting: Internal Medicine

## 2020-11-12 DIAGNOSIS — Z1231 Encounter for screening mammogram for malignant neoplasm of breast: Secondary | ICD-10-CM

## 2020-12-17 ENCOUNTER — Other Ambulatory Visit: Payer: Self-pay

## 2020-12-23 ENCOUNTER — Other Ambulatory Visit: Payer: Self-pay

## 2020-12-23 ENCOUNTER — Ambulatory Visit
Admission: RE | Admit: 2020-12-23 | Discharge: 2020-12-23 | Disposition: A | Discharge status: Home / Self Care | Payer: Medicaid Other | Source: Ambulatory Visit | Attending: Internal Medicine | Admitting: Internal Medicine

## 2020-12-23 DIAGNOSIS — Z1231 Encounter for screening mammogram for malignant neoplasm of breast: Secondary | ICD-10-CM | POA: Insufficient documentation

## 2021-01-16 IMAGING — US US ABDOMEN LIMITED
1 series · 14 of 25 positions shown · non-contrast
Comparison: None.

CLINICAL DATA: Elevated LFTs

EXAM:
ULTRASOUND ABDOMEN LIMITED RIGHT UPPER QUADRANT

[Series 1: us abdomen limited · 0.26mm/px · 14 of 26 slices shown]
[im 1/26]
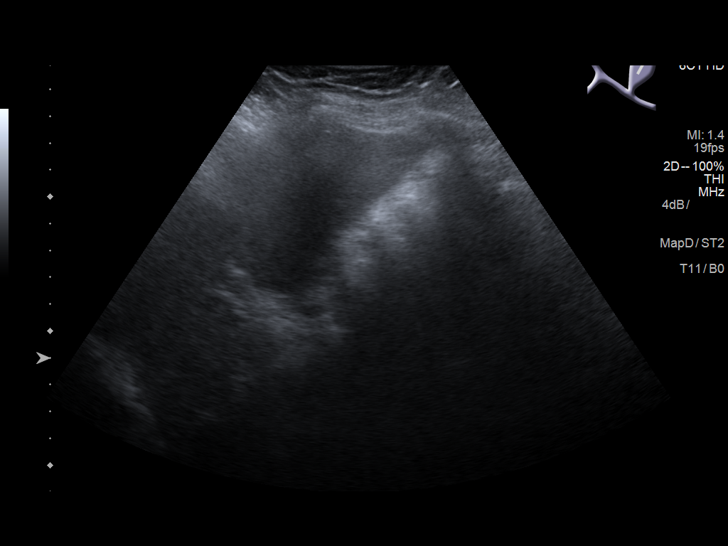
[im 3/26]
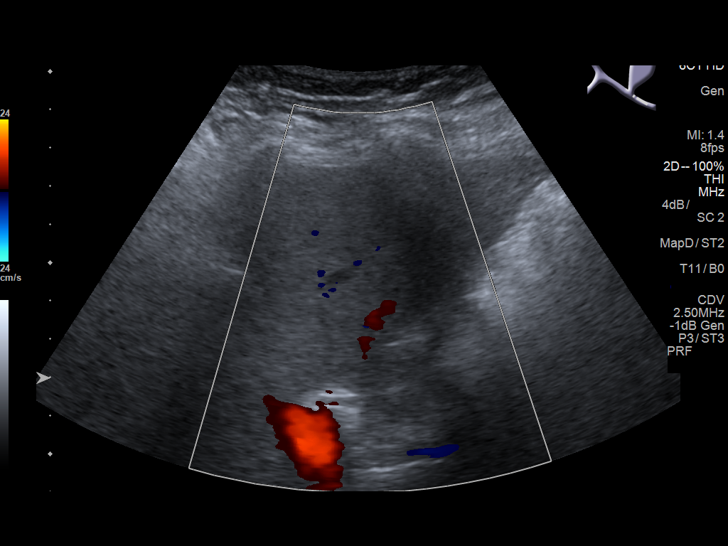
[im 5/26]
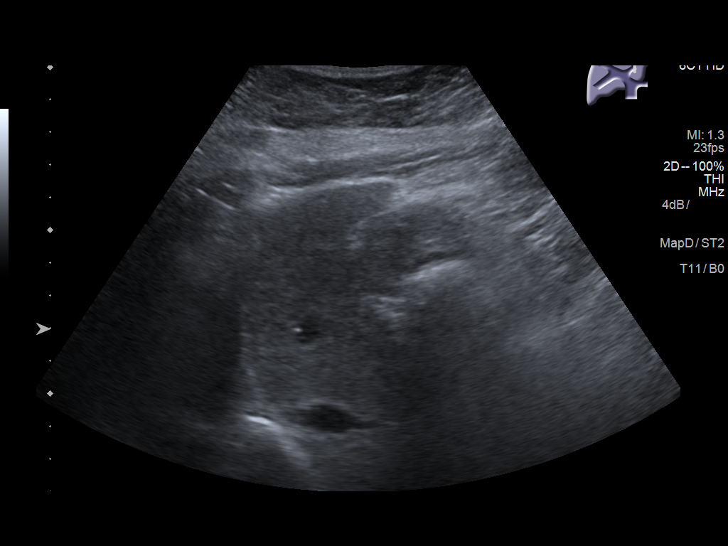
[im 7/26]
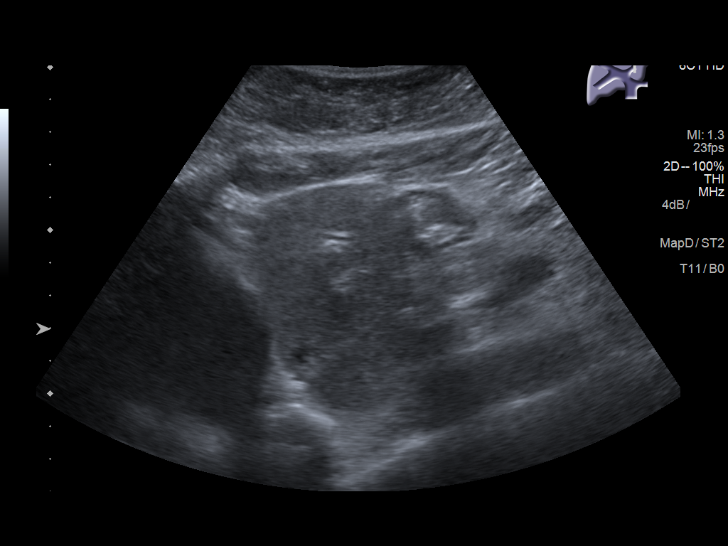
[im 9/26]
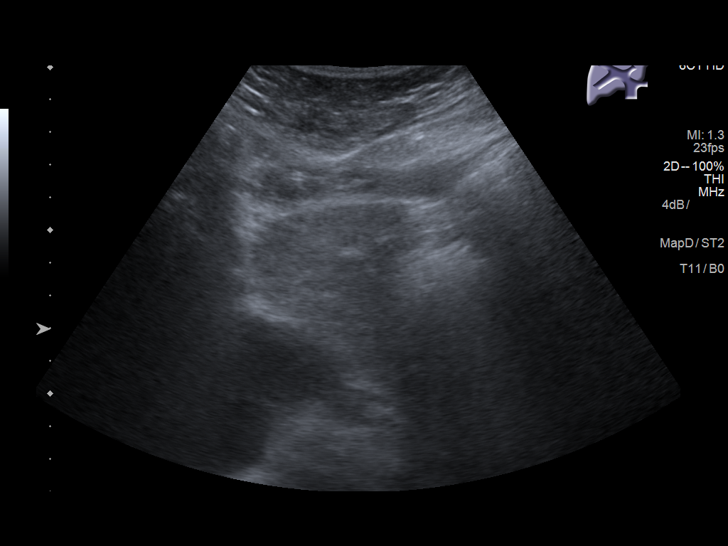
[im 10/26]
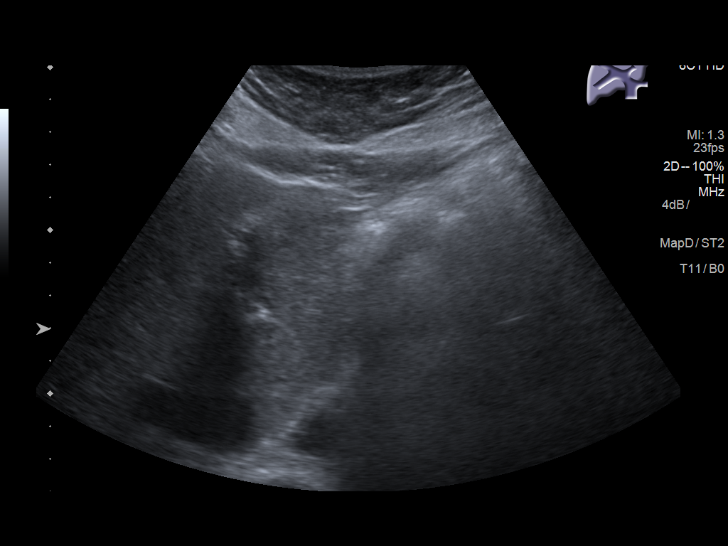
[im 12/26]
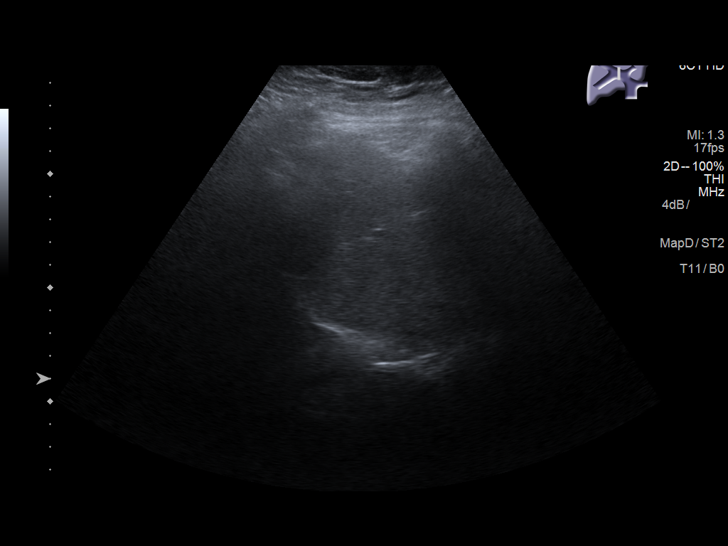
[im 14/26]
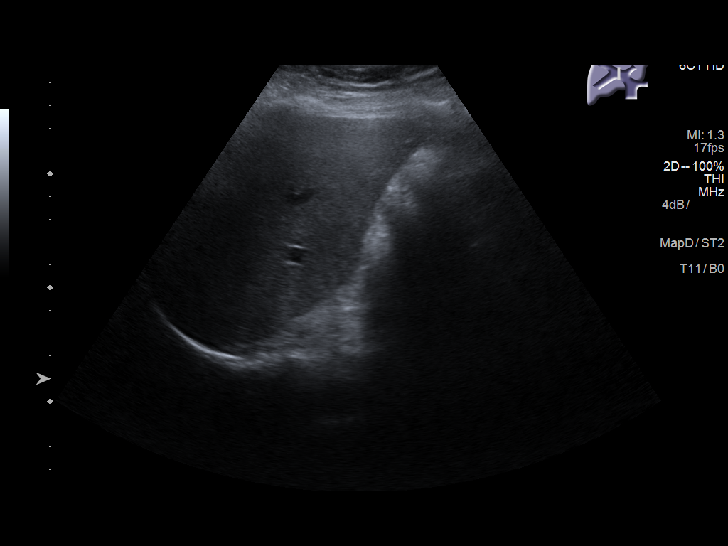
[im 16/26]
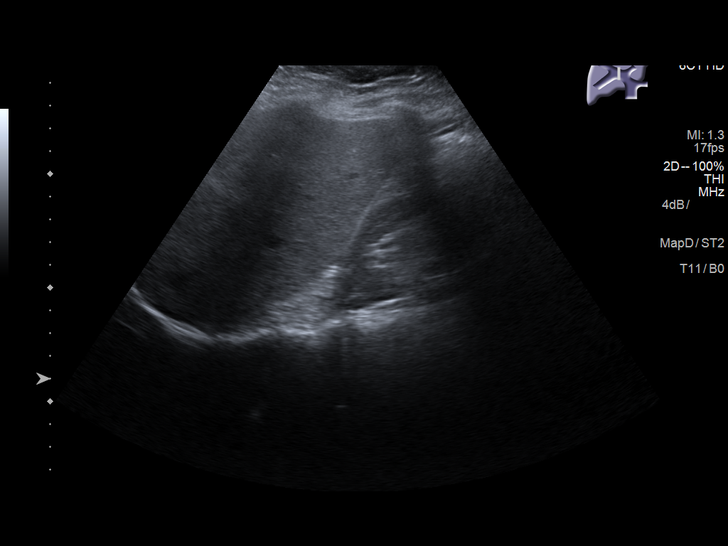
[im 17/26]
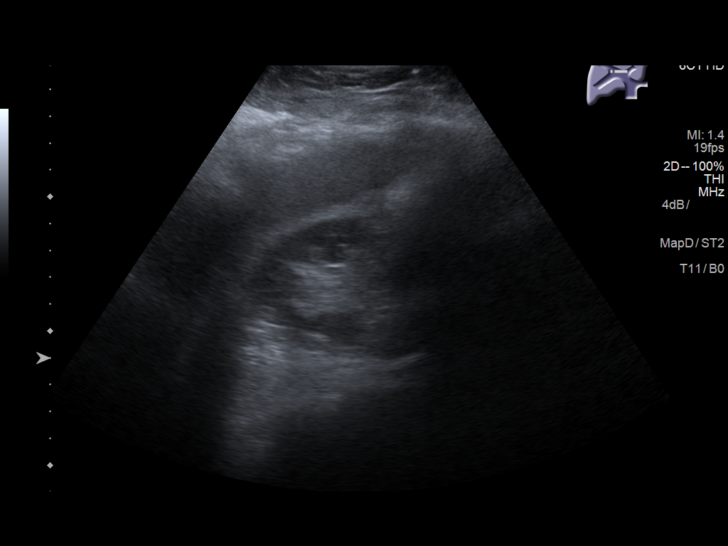
[im 19/26]
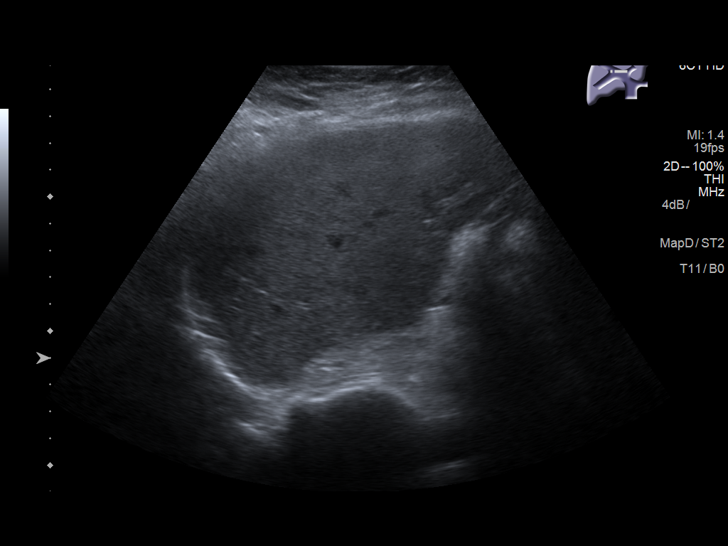
[im 21/26]
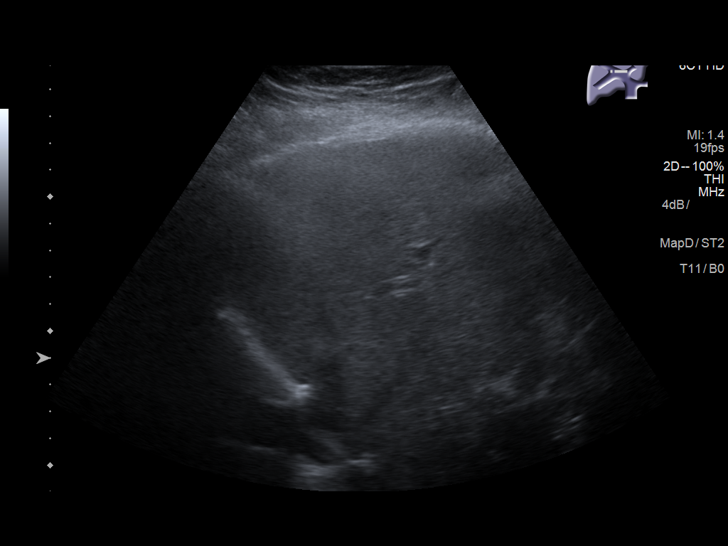
[im 23/26]
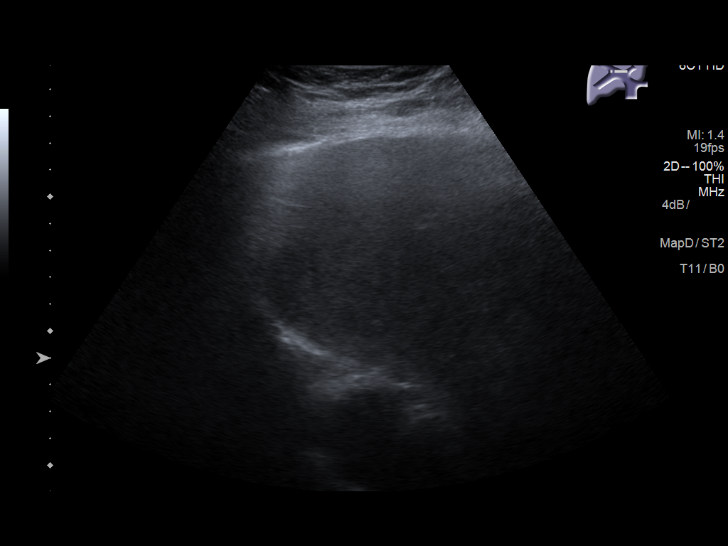
[im 26/26]
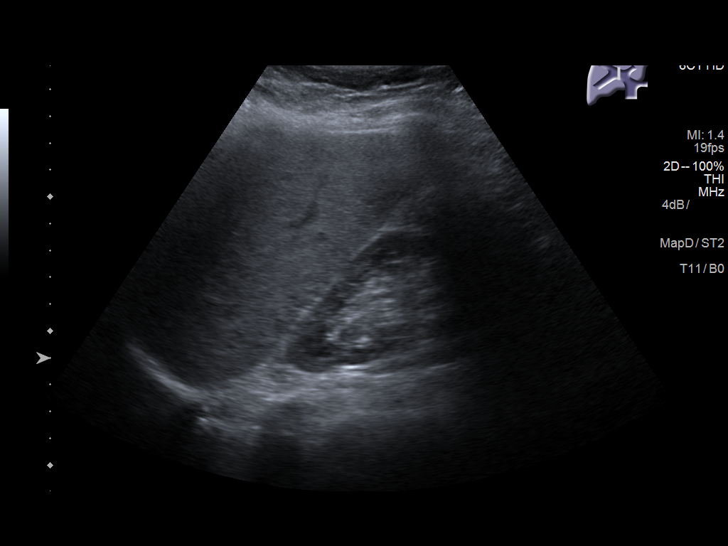

[14 of 25 positions shown; findings below may reference images not displayed]

FINDINGS: Gallbladder:

Surgically removed

Common bile duct:

Diameter: 3 mm

Liver:

Diffuse increased echogenicity is noted without focal lesion. Portal
vein is patent on color Doppler imaging with normal direction of
blood flow towards the liver.
IMPRESSION: Status post cholecystectomy.

Fatty liver.

No acute abnormality noted.

## 2021-09-29 ENCOUNTER — Other Ambulatory Visit: Payer: Self-pay

## 2021-09-29 ENCOUNTER — Ambulatory Visit: Payer: Medicaid Other | Admitting: Gastroenterology

## 2021-09-29 VITALS — BP 104/65 | HR 54 | Temp 98.2°F | Wt 125.2 lb

## 2021-09-29 DIAGNOSIS — R748 Abnormal levels of other serum enzymes: Secondary | ICD-10-CM

## 2021-09-29 NOTE — Progress Notes (Signed)
[name], MD [address]  [place]  [place], [place] [address]  Main: [phone]  Fax: [phone]   Primary Care Physician: Baxter Hire, MD   Chief complaint: History of pancreatitis   HPI: Madison Harvey is a 60 y.o. female previously seen after an episode of pancreatitis here for follow-up. The patient denies abdominal or flank pain, anorexia, nausea or vomiting, dysphagia, change in bowel habits or black or bloody stools or weight loss.  Patient recently had colonoscopy in 2021 in [place].  Records reviewed and were scanned into the chart.  Small 1 mm polyps were noted and pathology showed hyperplastic and tubular adenoma polyps.  Previous history: isolated episode of pancreatitis in January 2020    Patient has undergone EUS since her last visit in December 2020, with no concerning findings.  Please see procedure report for details.   Today she brings colonoscopy report from [place] which is in [place] and was done this year.  Colonoscopy was done due to her previous history of polyps in June 2016 with 2 polyps removed and patient was recommended to have repeat in 5 years.  It appears that one small polyp was removed during this procedure and pathology showed mixed adenomatous and hyperplastic polyp.  However, since the report was in Spanish we will need to get this officially translated and scanned into her chart and this has been sent by the staff to the translators.   She brings medications that were prescribed to her in [place].  This includes Creon that was prescribed.  Patient denies any abdominal bloating, diarrhea, reports 1 small bowel movement a day.  She has never had symptoms of chronic pancreatitis.  Since she does not have any symptoms of chronic pancreatitis, Creon was discontinued   she also has an abdominal ultrasound report in Spanish that was reported to have a normal pancreas   Previous history: was seen in the hospital in January 2020  due to pancreatitis as evidenced by elevated lipase of over 10,000 and imaging findings.  No prior history of pancreatitis before that.  No alcohol use or smoking.  Patient was taking a glucosamine chondroitin supplement which was new for her 2 weeks prior to presentation.  No other supplements or over-the-counter products.  Previous history of cholecystectomy 6 to 7 years ago.   MRI did not show any choledocholithiasis.  However liver enzymes were elevated on admission and improved subsequently with suspicion if small sludge or stones that have passed that could have caused the pancreatitis but the etiology remains unclear.  Triglycerides were not elevated and were around 150.   Since this admission patient did go to [facility] ER in February 2020 due to abdominal pain which reported a normal pancreas.  CT scan during that visit reported. "thickening of the terminal ileum wall, which could be due to contracted state and /or mild ileitis. Mild dilatation of the small bowel loops with mildly increased wall enhancement is also identified. This is not specific but could be seen secondary to diarrheal state and/or mild enteritis."    She underwent MRE in May 2020 to evaluate the above CT findings showing abnormalities in the small bowel loops.  The MRE was an unremarkable exam, with no abnormal bowel wall thickening or enhancement.   Work-up for elevated liver enzymes during the January admission showed previous infection and natural immunity to hepatitis B.  Normal IgG4 and no evidence of autoimmune hepatitis on lab work.  Patient is also immune to  hepatitis A.  Her liver enzymes have since improved and she only has mild elevation to alk phos which appears to be chronic.   Most recent liver enzymes were done today, August 2020, which showed mild chronic elevation in alk phos, but normal GGT.   Colonoscopy is up-to-date and was done in June 2016 and 2 polyps were removed and patient states repeat was recommended  in 5 years.  This has been reviewed.   ROS: All ROS reviewed and negative except as per HPI   Past Medical History:  Diagnosis Date   Hyperlipidemia     Past Surgical History:  Procedure Laterality Date   CHOLECYSTECTOMY     COLONOSCOPY WITH PROPOFOL N/A 05/17/2015   Procedure: COLONOSCOPY WITH PROPOFOL;  Surgeon: Manya Silvas, MD;  Location: [facility] ENDOSCOPY;  Service: Endoscopy;  Laterality: N/A;    Prior to Admission medications   Medication Sig Start Date End Date Taking? Authorizing Provider  gemfibrozil (LOPID) 600 MG tablet Take 1 tablet by mouth daily. 05/23/20  Yes [provider]  levothyroxine (SYNTHROID) 25 MCG tablet Take 1 tablet by mouth daily. 05/23/20  Yes [provider]  Pancrelipase, Lip-Prot-Amyl, 3000-9500 units CPEP Take 1 capsule by mouth daily. Patient not taking: Reported on 09/29/2021 08/16/20   [provider]    Family History  Problem Relation Age of Onset   Arthritis Mother    Thyroid disease Mother    Thyroid disease Brother    Breast cancer Neg Hx      Social History   Tobacco Use   Smoking status: Never   Smokeless tobacco: Never  Substance Use Topics   Alcohol use: No   Drug use: No    Allergies as of 09/29/2021   (No Known Allergies)    Physical Examination:  Constitutional: General:   Alert,  Well-developed, well-nourished, pleasant and cooperative in NAD BP 104/65   Pulse (!) 54   Temp 98.2 F (36.8 C) (Oral)   Wt 125 lb 3.2 oz (56.8 kg)   BMI 22.90 kg/m   Respiratory: Normal respiratory effort  Gastrointestinal:  Soft, non-tender and non-distended without masses, hepatosplenomegaly or hernias noted.  No guarding or rebound tenderness.     Cardiac: No clubbing or edema.  No cyanosis. Normal posterior tibial pedal pulses noted.  Psych:  Alert and cooperative. Normal mood and affect.  Musculoskeletal:  Normal gait. Head normocephalic, atraumatic. Symmetrical without gross deformities. 5/5  Lower extremity strength bilaterally.  Skin: Warm. Intact without significant lesions or rashes. No jaundice.  Neck: Supple, trachea midline  Lymph: No cervical lymphadenopathy  Psych:  Alert and oriented x3, Alert and cooperative. Normal mood and affect.  Labs: CMP     Component Value Date/Time   NA 141 12/24/2018 0444   K 3.5 12/24/2018 0444   CL 110 12/24/2018 0444   CO2 28 12/24/2018 0444   GLUCOSE 93 12/24/2018 0444   BUN 6 12/24/2018 0444   CREATININE 0.51 12/24/2018 0444   CALCIUM 8.3 (L) 12/24/2018 0444   PROT 6.5 07/19/2019 1426   ALBUMIN 4.4 07/19/2019 1426   AST 17 07/19/2019 1426   ALT 17 07/19/2019 1426   ALT BLOOD 12/22/2018 0938   ALKPHOS 121 (H) 07/19/2019 1426   BILITOT 0.5 07/19/2019 1426   GFRNONAA >60 12/24/2018 0444   GFRAA >60 12/24/2018 0444   Lab Results  Component Value Date   WBC 7.1 12/24/2018   HGB 9.7 (L) 12/24/2018   HCT 30.2 (L) 12/24/2018   MCV 92.1  12/24/2018   PLT 196 12/24/2018  June 2022 Hemoglobin 12.01 November 2020 alk phos 116 June 2021 alk phos 123  Imaging Studies:   Assessment and Plan:   Caydee Talkington is a 60 y.o. y/o female with isolated episode of acute pancreatitis, with etiology thought to be passed gallstones, with prior history of cholecystectomy, here for follow-up  Patient has completed full work-up including EUS as described above  Her colonoscopy records from 2021 from [place] reviewed  She did have tubular adenoma polyps during that colonoscopy.  She states no official recommendations were made after the colonoscopy in regard to surveillance.  Surveillance in 7 years would be appropriate given that 1 mm polypoid lesions were noted in the rectum and a tubular adenoma was seen.  In addition she had a colonoscopy in 2016 as well.  She does not have any history of advanced polyps or colon cancer  Patient advised to reach out to [place] if she has any new GI symptoms of concern, otherwise follow-up with [place] in 1  year  Alkaline phosphatase mildly elevated in December 2021 and prior to that.  We will repeat along with GGT.  CBC recently reassuring  Dr [name]

## 2021-10-03 ENCOUNTER — Telehealth: Payer: Self-pay

## 2021-10-03 NOTE — Telephone Encounter (Signed)
-----   Message from Virgel Manifold, MD sent at 09/29/2021 12:34 PM EDT ----- Madison Harvey I ordered some labs for this pt because outside labs show elevated alkphos.  Can you please have her go to lab to get these done.  Thank you

## 2021-10-03 NOTE — Telephone Encounter (Signed)
Pt states she will go to [facility] [place] [facility] in [place]

## 2021-10-07 LAB — HEPATIC FUNCTION PANEL
ALT: 11 IU/L (ref 0–32)
AST: 17 IU/L (ref 0–40)
Albumin: 4.6 g/dL (ref 3.8–4.9)
Alkaline Phosphatase: 120 IU/L (ref 44–121)
Bilirubin Total: 0.4 mg/dL (ref 0.0–1.2)
Bilirubin, Direct: 0.11 mg/dL (ref 0.00–0.40)
Total Protein: 7.1 g/dL (ref 6.0–8.5)

## 2021-10-07 LAB — GAMMA GT: GGT: 7 IU/L (ref 0–60)

## 2021-12-04 ENCOUNTER — Other Ambulatory Visit: Payer: Self-pay | Admitting: Internal Medicine

## 2021-12-04 DIAGNOSIS — Z1231 Encounter for screening mammogram for malignant neoplasm of breast: Secondary | ICD-10-CM

## 2022-02-24 ENCOUNTER — Other Ambulatory Visit: Payer: Self-pay

## 2022-02-24 ENCOUNTER — Ambulatory Visit
Admission: RE | Admit: 2022-02-24 | Discharge: 2022-02-24 | Disposition: A | Discharge status: Home / Self Care | Payer: Medicaid Other | Source: Ambulatory Visit | Attending: Internal Medicine | Admitting: Internal Medicine

## 2022-02-24 DIAGNOSIS — Z1231 Encounter for screening mammogram for malignant neoplasm of breast: Secondary | ICD-10-CM | POA: Insufficient documentation

## 2022-11-12 ENCOUNTER — Other Ambulatory Visit: Payer: Self-pay

## 2022-11-12 ENCOUNTER — Emergency Department: Payer: Medicaid Other

## 2022-11-12 ENCOUNTER — Emergency Department
Admission: EM | Admit: 2022-11-12 | Discharge: 2022-11-12 | Disposition: A | Discharge status: Home / Self Care | Payer: Medicaid Other | Attending: Emergency Medicine | Admitting: Emergency Medicine

## 2022-11-12 DIAGNOSIS — R531 Weakness: Secondary | ICD-10-CM | POA: Insufficient documentation

## 2022-11-12 DIAGNOSIS — R202 Paresthesia of skin: Secondary | ICD-10-CM | POA: Diagnosis present

## 2022-11-12 LAB — DIFFERENTIAL
Abs Immature Granulocytes: 0.03 10*3/uL (ref 0.00–0.07)
Basophils Absolute: 0 10*3/uL (ref 0.0–0.1)
Basophils Relative: 1 %
Eosinophils Absolute: 0.1 10*3/uL (ref 0.0–0.5)
Eosinophils Relative: 1 %
Immature Granulocytes: 1 %
Lymphocytes Relative: 30 %
Lymphs Abs: 2 10*3/uL (ref 0.7–4.0)
Monocytes Absolute: 0.4 10*3/uL (ref 0.1–1.0)
Monocytes Relative: 7 %
Neutro Abs: 4.1 10*3/uL (ref 1.7–7.7)
Neutrophils Relative %: 60 %

## 2022-11-12 LAB — COMPREHENSIVE METABOLIC PANEL
ALT: 17 U/L (ref 0–44)
AST: 20 U/L (ref 15–41)
Albumin: 3.8 g/dL (ref 3.5–5.0)
Alkaline Phosphatase: 84 U/L (ref 38–126)
Anion gap: 7 (ref 5–15)
BUN: 14 mg/dL (ref 6–20)
CO2: 28 mmol/L (ref 22–32)
Calcium: 9.1 mg/dL (ref 8.9–10.3)
Chloride: 106 mmol/L (ref 98–111)
Creatinine, Ser: 0.73 mg/dL (ref 0.44–1.00)
GFR, Estimated: >60 mL/min (ref >60)
Glucose, Bld: 122 mg/dL — ABNORMAL HIGH (ref 70–99)
Potassium: 3.7 mmol/L (ref 3.5–5.1)
Sodium: 141 mmol/L (ref 135–145)
Total Bilirubin: 1.2 mg/dL (ref 0.3–1.2)
Total Protein: 7.4 g/dL (ref 6.5–8.1)

## 2022-11-12 LAB — PROTIME-INR
INR: 1 (ref 0.8–1.2)
Prothrombin Time: 13.5 seconds (ref 11.4–15.2)

## 2022-11-12 LAB — APTT: aPTT: 31 s (ref 24–36)

## 2022-11-12 LAB — CBC
HCT: 40.2 % (ref 36.0–46.0)
Hemoglobin: 12.7 g/dL (ref 12.0–15.0)
MCH: 28.5 pg (ref 26.0–34.0)
MCHC: 31.6 g/dL (ref 30.0–36.0)
MCV: 90.1 fL (ref 80.0–100.0)
Platelets: 309 10*3/uL (ref 150–400)
RBC: 4.46 MIL/uL (ref 3.87–5.11)
RDW: 13 % (ref 11.5–15.5)
WBC: 6.6 10*3/uL (ref 4.0–10.5)
nRBC: 0 % (ref 0.0–0.2)

## 2022-11-12 LAB — LIPASE, BLOOD: Lipase: 43 U/L (ref 11–51)

## 2022-11-12 LAB — POC URINE PREG, ED: Preg Test, Ur: NEGATIVE — NL

## 2022-11-12 MED ORDER — LORAZEPAM 2 MG/ML IJ SOLN
0.5000 mg | Freq: Once | INTRAMUSCULAR | Status: AC
  Administered 2022-11-12: 0.5 mg via INTRAVENOUS
  Filled 2022-11-12: qty 1

## 2022-11-12 NOTE — ED Notes (Signed)
ED Provider at bedside. 

## 2022-11-12 NOTE — Discharge Instructions (Signed)
Please seek medical attention for any high fevers, chest pain, shortness of breath, change in behavior, persistent vomiting, bloody stool or any other new or concerning symptoms.  

## 2022-11-12 NOTE — ED Triage Notes (Signed)
Around 1:30 pt was driving and got light headed, pt then became "paralyzed" for about 10-15 minutes but pt was able to move her head and speak- pt states that now it's mostly a headache that is bothering her but she will occasionally have tremors- pt states something similar happened years ago and it was her cholesterol

## 2022-11-12 NOTE — ED Provider Notes (Signed)
[facility] Provider Note    Event Date/Time   First MD Initiated Contact with Patient 11/12/22 1623     (approximate)   History   Weakness   HPI  Madison Harvey is a 61 y.o. female  who presents to the emergency department today because of concern for an episode of full body tingling. The patient states she was driving her car when she started to feel light headed. She then started feeling tingling throughout her body. The patient pulled over and felt like she was having a hard time moving her body. This lasted for about 10 minutes before she started getting her function back. States she had a similar thing happen a number of years ago. She says it was due to pancreatitis and high cholesterol at that time.        Physical Exam   Triage Vital Signs: ED Triage Vitals  Enc Vitals Group     BP 11/12/22 1604 (!) 146/52     Pulse Rate 11/12/22 1604 67     Resp 11/12/22 1604 18     Temp 11/12/22 1604 98 F (36.7 C)     Temp Source 11/12/22 1604 Oral     SpO2 11/12/22 1604 98 %     Weight 11/12/22 1607 140 lb (63.5 kg)     Height 11/12/22 1607 '5\' 1"'$  (1.549 m)     Head Circumference --      Peak Flow --      Pain Score 11/12/22 1607 2     Pain Loc --      Pain Edu? --      Excl. in [place]? --     Most recent vital signs: Vitals:   11/12/22 1604  BP: (!) 146/52  Pulse: 67  Resp: 18  Temp: 98 F (36.7 C)  SpO2: 98%   General: Awake, alert, oriented. CV:  Good peripheral perfusion. Regular rate and rhythm. Resp:  Normal effort. Lungs clear. Abd:  No distention.    ED Results / Procedures / Treatments   Labs (all labs ordered are listed, but only abnormal results are displayed) Labs Reviewed  COMPREHENSIVE METABOLIC PANEL - Abnormal; Notable for the following components:      Result Value   Glucose, Bld 122 (*)    All other components within normal limits  PROTIME-INR  APTT  CBC  DIFFERENTIAL  POC URINE PREG, ED     EKG  I, Nance Pear, attending physician, personally viewed and interpreted this EKG  EKG Time: 1607 Rate: 63 Rhythm: sinus rhythm Axis: normal Intervals: qtc 427 QRS: LVH ST changes: no st elevation Impression: abnormal ekg   RADIOLOGY I independently interpreted and visualized the CT head. My interpretation: No bleed Radiology interpretation:  IMPRESSION:  No acute intracranial pathology.      PROCEDURES:  Critical Care performed: No  Procedures   MEDICATIONS ORDERED IN ED: Medications - No data to display   IMPRESSION / MDM / [place] / ED COURSE  I reviewed the triage vital signs and the nursing notes.                              Differential diagnosis includes, but is not limited to, electrolyte abnormality, infection, intracranial process, panic attack.   Patient's presentation is most consistent with acute presentation with potential threat to life or bodily function.  Patient presented to the emergency department today after an  episode of feeling lightheaded and then feeling tingling throughout her body.  At time of exam patient is feeling better.  Blood work without any concerning electrolyte abnormalities.  CT head did not show any concerning abnormalities.  This time I do wonder if patient might of had a panic attack.  She did feel improvement after Ativan here.  Given her reassuring workup I think it is reasonable for patient be discharged home.  FINAL CLINICAL IMPRESSION(S) / ED DIAGNOSES   Final diagnoses:  Paresthesias       Note:  This document was prepared using Dragon voice recognition software and may include unintentional dictation errors.    Nance Pear, MD 11/12/22 [id number]

## 2022-11-12 NOTE — ED Notes (Signed)
Discharge instructions provided by edp were reinforced to pt. Pt verbalized understanding with no questions at this time. Pt ambulatory at time to discharge.

## 2023-02-15 ENCOUNTER — Other Ambulatory Visit: Payer: Self-pay | Admitting: Nurse Practitioner

## 2023-02-15 DIAGNOSIS — Z1231 Encounter for screening mammogram for malignant neoplasm of breast: Secondary | ICD-10-CM

## 2023-03-03 ENCOUNTER — Ambulatory Visit
Admission: RE | Admit: 2023-03-03 | Discharge: 2023-03-03 | Disposition: A | Discharge status: Home / Self Care | Payer: Medicaid Other | Source: Ambulatory Visit | Attending: Nurse Practitioner | Admitting: Nurse Practitioner

## 2023-03-03 DIAGNOSIS — Z1231 Encounter for screening mammogram for malignant neoplasm of breast: Secondary | ICD-10-CM | POA: Diagnosis not present

## 2023-09-27 ENCOUNTER — Encounter: Payer: Self-pay | Admitting: Internal Medicine

## 2023-09-29 ENCOUNTER — Encounter: Payer: Self-pay | Admitting: Internal Medicine

## 2023-10-05 ENCOUNTER — Encounter: Payer: Self-pay | Admitting: Internal Medicine

## 2023-10-06 ENCOUNTER — Ambulatory Visit: Payer: Medicaid Other | Admitting: Anesthesiology

## 2023-10-06 ENCOUNTER — Encounter
Admission: RE | Disposition: A | Discharge status: Home / Self Care | Payer: Self-pay | Source: Home / Self Care | Attending: Internal Medicine

## 2023-10-06 ENCOUNTER — Other Ambulatory Visit: Payer: Self-pay

## 2023-10-06 ENCOUNTER — Ambulatory Visit
Admission: RE | Admit: 2023-10-06 | Discharge: 2023-10-06 | Disposition: A | Discharge status: Home / Self Care | Payer: Medicaid Other | Attending: Internal Medicine | Admitting: Internal Medicine

## 2023-10-06 ENCOUNTER — Encounter: Payer: Self-pay | Admitting: Internal Medicine

## 2023-10-06 DIAGNOSIS — K8500 Idiopathic acute pancreatitis without necrosis or infection: Secondary | ICD-10-CM | POA: Insufficient documentation

## 2023-10-06 DIAGNOSIS — Z79890 Hormone replacement therapy (postmenopausal): Secondary | ICD-10-CM | POA: Diagnosis not present

## 2023-10-06 DIAGNOSIS — Z9049 Acquired absence of other specified parts of digestive tract: Secondary | ICD-10-CM | POA: Diagnosis not present

## 2023-10-06 DIAGNOSIS — Z09 Encounter for follow-up examination after completed treatment for conditions other than malignant neoplasm: Secondary | ICD-10-CM | POA: Insufficient documentation

## 2023-10-06 DIAGNOSIS — K760 Fatty (change of) liver, not elsewhere classified: Secondary | ICD-10-CM | POA: Insufficient documentation

## 2023-10-06 DIAGNOSIS — E039 Hypothyroidism, unspecified: Secondary | ICD-10-CM | POA: Insufficient documentation

## 2023-10-06 DIAGNOSIS — Z539 Procedure and treatment not carried out, unspecified reason: Secondary | ICD-10-CM | POA: Insufficient documentation

## 2023-10-06 DIAGNOSIS — K219 Gastro-esophageal reflux disease without esophagitis: Secondary | ICD-10-CM | POA: Insufficient documentation

## 2023-10-06 DIAGNOSIS — Z1211 Encounter for screening for malignant neoplasm of colon: Secondary | ICD-10-CM | POA: Insufficient documentation

## 2023-10-06 DIAGNOSIS — Z860101 Personal history of adenomatous and serrated colon polyps: Secondary | ICD-10-CM | POA: Insufficient documentation

## 2023-10-06 DIAGNOSIS — E785 Hyperlipidemia, unspecified: Secondary | ICD-10-CM | POA: Insufficient documentation

## 2023-10-06 HISTORY — DX: Nonalcoholic steatohepatitis (NASH): K75.81

## 2023-10-06 HISTORY — DX: Hypothyroidism, unspecified: E03.9

## 2023-10-06 HISTORY — DX: Bradycardia, unspecified: R00.1

## 2023-10-06 HISTORY — PX: COLONOSCOPY WITH PROPOFOL: SHX5780

## 2023-10-06 SURGERY — COLONOSCOPY WITH PROPOFOL
Anesthesia: General

## 2023-10-06 MED ORDER — SODIUM CHLORIDE 0.9 % IV SOLN: INTRAVENOUS | Status: DC

## 2023-10-06 MED ORDER — LIDOCAINE HCL (PF) 2 % IJ SOLN
INTRAMUSCULAR | Status: AC
  Filled 2023-10-06: qty 5

## 2023-10-06 MED ORDER — PROPOFOL 10 MG/ML IV BOLUS
INTRAVENOUS | Status: AC
  Filled 2023-10-06: qty 40

## 2023-10-06 NOTE — H&P (Signed)
Outpatient short stay form Pre-procedure 10/06/2023 10:46 AM Kelvyn Schunk K. Norma Fredrickson, M.D.  Primary Physician: Edmonia Lynch, FNP  Reason for visit:  Personal history of adenomatous colon polyps - 2016 and 2021.  History of present illness:   Ms. Henigan is a 62 yo female with primary language is [place]. She states she does not need an interpreter as she understands and speaks [place] well. She has a past medical history of hyperlipidemia, GERD, thyroid disease, acute idiopathic pancreatitis possibly due to passed stone with previous cholecystectomy and negative EUS (2020), hepatic steatosis with now nl LFTS and past mild elevation alk phos with normal GGT who comes in for follow-up colonoscopy for personal history of colon polyps. She was previously followed by [facility] and was last seen 09/29/2021.   Chart reviewed: Colonoscopy records from 2021 from [place] reviewed by Dr. Tawni Carnes and she did have tubular adenoma polyps during that colonoscopy.    Current Facility-Administered Medications:    0.9 %  sodium chloride infusion, , Intravenous, Continuous, [place], Boykin Nearing, MD, Last Rate: 20 mL/hr at 10/06/23 1019, New Bag at 10/06/23 1019  Medications Prior to Admission  Medication Sig Dispense Refill Last Dose   Cholecalciferol (VITAMIN D3 PO) Take 400 Units by mouth daily.   Past Week   gemfibrozil (LOPID) 600 MG tablet Take 1 tablet by mouth daily.   10/05/2023   levothyroxine (SYNTHROID) 25 MCG tablet Take 1 tablet by mouth daily.   10/06/2023 at 0600   Pancrelipase, Lip-Prot-Amyl, 3000-9500 units CPEP Take 1 capsule by mouth daily. (Patient not taking: Reported on 09/29/2021)        No Known Allergies   Past Medical History:  Diagnosis Date   Hyperlipidemia    Hypothyroidism    NASH (nonalcoholic steatohepatitis)    Sinus bradycardia     Review of systems:  Otherwise negative.    Physical Exam  Gen: Alert, oriented. Appears stated age.  HEENT: [place]/AT. PERRLA. Lungs: CTA,  no wheezes. CV: RR nl S1, S2. Abd: soft, benign, no masses. BS+ Ext: No edema. Pulses 2+    Planned procedures: Proceed with colonoscopy. The patient understands the nature of the planned procedure, indications, risks, alternatives and potential complications including but not limited to bleeding, infection, perforation, damage to internal organs and possible oversedation/side effects from anesthesia. The patient agrees and gives consent to proceed.  Please refer to procedure notes for findings, recommendations and patient disposition/instructions.     Harden Bramer K. Norma Fredrickson, M.D. Gastroenterology 10/06/2023  10:46 AM

## 2023-10-06 NOTE — Progress Notes (Signed)
Patient decided not to have her procedure, that she was tired of waiting. Patient was standing in her bay with IV in. Had patient sit on the stretcher and removed her IV and held pressure for the approximate amount of time.  Before pulling the curtain for her to get dressed,I  said I would be back. Went to inform the room of her decision. . Unit RN witnessed patient leaning against the wall and sliding. RN helped the patient to the floor. Came back to find the patient on the floor with Dr. Ronni Rumble and  staff at her side. Per Dr. Ronni Rumble we did a set of vital- in normal ranges. Dr. Ronni Rumble asked the patient if she was hurt, patient stated no. Patient asked if she felt dizzy, patient stated yes. Patient asked if she could sit up and she stated yes. Patient assisted to a wheelchair and given juice and patient had RN at side. Patient drank some juice and asked again was she feeling better and was she hurt, patient said she was feeling better and she was not hurt. Patient taken to her rides truck by RN and assisted into the car and seatbelt before leaving asked again if she was hurt and she stated no. Marland Kitchen

## 2023-10-06 NOTE — Interval H&P Note (Signed)
History and Physical Interval Note:  10/06/2023 10:47 AM  Madison Harvey  has presented today for surgery, with the diagnosis of Z86.0101 (ICD-10-CM) - Hx of adenomatous colonic polyps.  The various methods of treatment have been discussed with the patient and family. After consideration of risks, benefits and other options for treatment, the patient has consented to  Procedure(s): COLONOSCOPY WITH PROPOFOL (N/A) as a surgical intervention.  The patient's history has been reviewed, patient examined, no change in status, stable for surgery.  I have reviewed the patient's chart and labs.  Questions were answered to the patient's satisfaction.     [place], [place]

## 2023-10-06 NOTE — Anesthesia Preprocedure Evaluation (Addendum)
Anesthesia Evaluation  Patient identified by MRN, date of birth, ID band Patient awake    Reviewed: Allergy & Precautions, NPO status , Patient's Chart, lab work & pertinent test results  History of Anesthesia Complications Negative for: history of anesthetic complications  Airway Mallampati: III   Neck ROM: Full    Dental  (+) Upper Dentures   Pulmonary neg pulmonary ROS   Pulmonary exam normal breath sounds clear to auscultation       Cardiovascular Exercise Tolerance: Good negative cardio ROS Normal cardiovascular exam Rhythm:Regular Rate:Normal     Neuro/Psych  PSYCHIATRIC DISORDERS (panic disorder)      negative neurological ROS     GI/Hepatic negative GI ROS,,,(+) Hepatitis - (NASH)  Endo/Other  Hypothyroidism  Prediabetes   Renal/GU negative Renal ROS     Musculoskeletal   Abdominal   Peds  Hematology negative hematology ROS (+)   Anesthesia Other Findings   Reproductive/Obstetrics                             Anesthesia Physical Anesthesia Plan  ASA: 2  Anesthesia Plan: General   Post-op Pain Management:    Induction: Intravenous  PONV Risk Score and Plan: 3 and Propofol infusion, TIVA and Treatment may vary due to age or medical condition  Airway Management Planned: Natural Airway  Additional Equipment:   Intra-op Plan:   Post-operative Plan:   Informed Consent: I have reviewed the patients History and Physical, chart, labs and discussed the procedure including the risks, benefits and alternatives for the proposed anesthesia with the patient or authorized representative who has indicated his/her understanding and acceptance.       Plan Discussed with: CRNA  Anesthesia Plan Comments: (Spanish interpreter at bedside for history and consent. LMA/GETA backup discussed.  Patient consented for risks of anesthesia including but not limited to:  - adverse reactions  to medications - damage to eyes, teeth, lips or other oral mucosa - nerve damage due to positioning  - sore throat or hoarseness - damage to heart, brain, nerves, lungs, other parts of body or loss of life  Informed patient about role of CRNA in peri- and intra-operative care.  Patient voiced understanding.  Note 10/06/23: patient left prior to procedure; could no longer wait.)       Anesthesia Quick Evaluation

## 2023-10-07 ENCOUNTER — Encounter: Payer: Self-pay | Admitting: Internal Medicine

## 2024-01-13 ENCOUNTER — Encounter: Payer: Self-pay | Admitting: Gastroenterology

## 2024-01-14 ENCOUNTER — Encounter: Payer: Self-pay | Admitting: Gastroenterology

## 2024-01-14 ENCOUNTER — Ambulatory Visit
Admission: RE | Admit: 2024-01-14 | Discharge: 2024-01-14 | Disposition: A | Discharge status: Home / Self Care | Payer: Medicaid Other | Attending: Gastroenterology | Admitting: Gastroenterology

## 2024-01-14 ENCOUNTER — Encounter
Admission: RE | Disposition: A | Discharge status: Home / Self Care | Payer: Self-pay | Source: Home / Self Care | Attending: Gastroenterology

## 2024-01-14 ENCOUNTER — Ambulatory Visit: Payer: Medicaid Other | Admitting: Anesthesiology

## 2024-01-14 DIAGNOSIS — K7581 Nonalcoholic steatohepatitis (NASH): Secondary | ICD-10-CM | POA: Diagnosis not present

## 2024-01-14 DIAGNOSIS — Z860100 Personal history of colon polyps, unspecified: Secondary | ICD-10-CM | POA: Diagnosis present

## 2024-01-14 DIAGNOSIS — E039 Hypothyroidism, unspecified: Secondary | ICD-10-CM | POA: Diagnosis not present

## 2024-01-14 DIAGNOSIS — K5730 Diverticulosis of large intestine without perforation or abscess without bleeding: Secondary | ICD-10-CM | POA: Diagnosis not present

## 2024-01-14 DIAGNOSIS — Z1211 Encounter for screening for malignant neoplasm of colon: Secondary | ICD-10-CM | POA: Diagnosis not present

## 2024-01-14 HISTORY — PX: COLONOSCOPY WITH PROPOFOL: SHX5780

## 2024-01-14 SURGERY — COLONOSCOPY WITH PROPOFOL
Anesthesia: General

## 2024-01-14 MED ORDER — GLYCOPYRROLATE 0.2 MG/ML IJ SOLN
INTRAMUSCULAR | Status: DC | PRN
  Administered 2024-01-14: .2 mg via INTRAVENOUS

## 2024-01-14 MED ORDER — PROPOFOL 10 MG/ML IV BOLUS
INTRAVENOUS | Status: DC | PRN
  Administered 2024-01-14: 60 mg via INTRAVENOUS

## 2024-01-14 MED ORDER — LIDOCAINE HCL (CARDIAC) PF 100 MG/5ML IV SOSY
PREFILLED_SYRINGE | INTRAVENOUS | Status: DC | PRN
  Administered 2024-01-14: 100 mg via INTRAVENOUS

## 2024-01-14 MED ORDER — SODIUM CHLORIDE 0.9 % IV SOLN
INTRAVENOUS | Status: DC
  Administered 2024-01-14: 500 mL via INTRAVENOUS

## 2024-01-14 MED ORDER — PROPOFOL 500 MG/50ML IV EMUL
INTRAVENOUS | Status: DC | PRN
  Administered 2024-01-14: 165 ug/kg/min via INTRAVENOUS

## 2024-01-14 NOTE — Anesthesia Procedure Notes (Signed)
Procedure Name: General with mask airway Date/Time: 01/14/2024 11:24 AM  Performed by: Mohammed Kindle, CRNAPre-anesthesia Checklist: Patient identified, Emergency Drugs available, Suction available and Patient being monitored Patient Re-evaluated:Patient Re-evaluated prior to induction Oxygen Delivery Method: Simple face mask Induction Type: IV induction Placement Confirmation: positive ETCO2 and breath sounds checked- equal and bilateral Dental Injury: Teeth and Oropharynx as per pre-operative assessment

## 2024-01-14 NOTE — Anesthesia Preprocedure Evaluation (Signed)
Anesthesia Evaluation  Patient identified by MRN, date of birth, ID band Patient awake    Reviewed: Allergy & Precautions, NPO status , Patient's Chart, lab work & pertinent test results  History of Anesthesia Complications Negative for: history of anesthetic complications  Airway Mallampati: III   Neck ROM: Full    Dental  (+) Upper Dentures, Dental Advidsory Given   Pulmonary neg pulmonary ROS   Pulmonary exam normal breath sounds clear to auscultation       Cardiovascular Exercise Tolerance: Good negative cardio ROS Normal cardiovascular exam Rhythm:Regular Rate:Normal     Neuro/Psych  PSYCHIATRIC DISORDERS (panic disorder)      negative neurological ROS     GI/Hepatic negative GI ROS,,,(+) Hepatitis - (NASH)  Endo/Other  Hypothyroidism  Prediabetes   Renal/GU negative Renal ROS     Musculoskeletal   Abdominal   Peds  Hematology negative hematology ROS (+)   Anesthesia Other Findings Past Medical History: No date: Hyperlipidemia No date: Hypothyroidism No date: NASH (nonalcoholic steatohepatitis) No date: Sinus bradycardia   Reproductive/Obstetrics                             Anesthesia Physical Anesthesia Plan  ASA: 2  Anesthesia Plan: General   Post-op Pain Management:    Induction: Intravenous  PONV Risk Score and Plan: 3 and Propofol infusion, TIVA and Treatment may vary due to age or medical condition  Airway Management Planned: Natural Airway  Additional Equipment:   Intra-op Plan:   Post-operative Plan:   Informed Consent: I have reviewed the patients History and Physical, chart, labs and discussed the procedure including the risks, benefits and alternatives for the proposed anesthesia with the patient or authorized representative who has indicated his/her understanding and acceptance.       Plan Discussed with: CRNA  Anesthesia Plan Comments: (Spanish  interpreter at bedside for history and consent. LMA/GETA backup discussed.  Patient consented for risks of anesthesia including but not limited to:  - adverse reactions to medications - damage to eyes, teeth, lips or other oral mucosa - nerve damage due to positioning  - sore throat or hoarseness - damage to heart, brain, nerves, lungs, other parts of body or loss of life  Informed patient about role of CRNA in peri- and intra-operative care.  Patient voiced understanding.  Note 10/06/23: patient left prior to procedure; could no longer wait.)       Anesthesia Quick Evaluation

## 2024-01-14 NOTE — Op Note (Signed)
[facility] Patient Name: Madison Harvey Procedure Date: 01/14/2024 11:06 AM MRN: [id number] Account #: [id number] Date of Birth: 1961/07/07 Admit Type: Outpatient Age: 63 Room: [facility] ENDO ROOM 1 Gender: Female Note Status: Finalized Instrument Name: Peds Colonoscope [id number] Procedure:             Colonoscopy Indications:           High risk colon cancer surveillance: Personal history                         of colonic polyps Providers:             Jaynie Collins DO, DO Referring MD:          No Local Md, MD (Referring MD) Medicines:             Monitored Anesthesia Care Complications:         No immediate complications. Estimated blood loss: None. Procedure:             Pre-Anesthesia Assessment:                        - Prior to the procedure, a History and Physical was                         performed, and patient medications and allergies were                         reviewed. The patient is competent. The risks and                         benefits of the procedure and the sedation options and                         risks were discussed with the patient. All questions                         were answered and informed consent was obtained.                         Patient identification and proposed procedure were                         verified by the physician, the nurse, the anesthetist                         and the technician in the endoscopy suite. Mental                         Status Examination: alert and oriented. Airway                         Examination: normal oropharyngeal airway and neck                         mobility. Respiratory Examination: clear to                         auscultation. CV Examination: RRR, no murmurs, no S3  or S4. Prophylactic Antibiotics: The patient does not                         require prophylactic antibiotics. Prior                         Anticoagulants: The patient  has taken no anticoagulant                         or antiplatelet agents. ASA Grade Assessment: II - A                         patient with mild systemic disease. After reviewing                         the risks and benefits, the patient was deemed in                         satisfactory condition to undergo the procedure. The                         anesthesia plan was to use monitored anesthesia care                         (MAC). Immediately prior to administration of                         medications, the patient was re-assessed for adequacy                         to receive sedatives. The heart rate, respiratory                         rate, oxygen saturations, blood pressure, adequacy of                         pulmonary ventilation, and response to care were                         monitored throughout the procedure. The physical                         status of the patient was re-assessed after the                         procedure.                        After obtaining informed consent, the colonoscope was                         passed under direct vision. Throughout the procedure,                         the patient's blood pressure, pulse, and oxygen                         saturations were monitored continuously. The  Colonoscope was introduced through the anus and                         advanced to the the terminal ileum, with                         identification of the appendiceal orifice and IC                         valve. The colonoscopy was performed without                         difficulty. The patient tolerated the procedure well.                         The quality of the bowel preparation was evaluated                         using the BBPS [facility] Bowel Preparation Scale) with                         scores of: Right Colon = 2 (minor amount of residual                         staining, small fragments of stool and/or opaque                          liquid, but mucosa seen well), Transverse Colon = 3                         (entire mucosa seen well with no residual staining,                         small fragments of stool or opaque liquid) and Left                         Colon = 3 (entire mucosa seen well with no residual                         staining, small fragments of stool or opaque liquid).                         The total BBPS score equals 8. The quality of the                         bowel preparation was excellent. The terminal ileum,                         ileocecal valve, appendiceal orifice, and rectum were                         photographed. Findings:      The perianal and digital rectal examinations were normal. Pertinent       negatives include normal sphincter tone.      Scattered small-mouthed diverticula were found in the entire colon.       Estimated blood loss: none.  The entire examined colon appeared normal on direct and retroflexion       views. Impression:            - Diverticulosis in the entire examined colon.                        - The entire examined colon is normal on direct and                         retroflexion views.                        - No specimens collected.                        -                        - Diverticulosis en todo el colon examinado.                        - Todo el colon examinado es normal en las vistas                         directa y en retroflexin.                        - No se recolectaron especmenes. Recommendation:        - Patient has a contact number available for                         emergencies. The signs and symptoms of potential                         delayed complications were discussed with the patient.                         Return to normal activities tomorrow. Written                         discharge instructions were provided to the patient.                        - Discharge patient to home.                         - Resume previous diet.                        - Continue present medications.                        - Repeat colonoscopy in 5 years for surveillance.                        - Return to referring physician as previously                         scheduled.                        - The findings and  recommendations were discussed with                         the patient. Procedure Code(s):     --- Professional ---                        [phone], Colonoscopy, flexible; diagnostic, including                         collection of specimen(s) by brushing or washing, when                         performed (separate procedure) Diagnosis Code(s):     --- Professional ---                        Z86.010, Personal history of colonic polyps                        K57.30, Diverticulosis of large intestine without                         perforation or abscess without bleeding CPT copyright 2022 American Medical Association. All rights reserved. The codes documented in this report are preliminary and upon coder review may  be revised to meet current compliance requirements. Attending Participation:      I personally performed the entire procedure. Elfredia Nevins, DO Jaynie Collins DO, DO 01/14/2024 11:54:09 AM This report has been signed electronically. Number of Addenda: 0 Note Initiated On: 01/14/2024 11:06 AM Scope Withdrawal Time: 0 hours 10 minutes 13 seconds  Total Procedure Duration: 0 hours 19 minutes 4 seconds  Estimated Blood Loss:  Estimated blood loss: none.      [facility]

## 2024-01-14 NOTE — Interval H&P Note (Signed)
History and Physical Interval Note: Preprocedure H&P from 01/14/24  was reviewed and there was no interval change after seeing and examining the patient.  Written consent was obtained from the patient after discussion of risks, benefits, and alternatives. Patient has consented to proceed with Colonoscopy with possible intervention   01/14/2024 11:23 AM  Madison Harvey  has presented today for surgery, with the diagnosis of Z86.0101 (ICD-10-CM) - Hx of adenomatous colonic polyps.  The various methods of treatment have been discussed with the patient and family. After consideration of risks, benefits and other options for treatment, the patient has consented to  Procedure(s) with comments: COLONOSCOPY WITH PROPOFOL (N/A) - SPANISH INTERPRETER as a surgical intervention.  The patient's history has been reviewed, patient examined, no change in status, stable for surgery.  I have reviewed the patient's chart and labs.  Questions were answered to the patient's satisfaction.     Jaynie Collins

## 2024-01-14 NOTE — Anesthesia Postprocedure Evaluation (Signed)
Anesthesia Post Note  Patient: Madison Harvey  Procedure(s) Performed: COLONOSCOPY WITH PROPOFOL  Patient location during evaluation: Endoscopy Anesthesia Type: General Level of consciousness: awake and alert Pain management: pain level controlled Vital Signs Assessment: post-procedure vital signs reviewed and stable Respiratory status: spontaneous breathing, nonlabored ventilation, respiratory function stable and patient connected to nasal cannula oxygen Cardiovascular status: blood pressure returned to baseline and stable Postop Assessment: no apparent nausea or vomiting Anesthetic complications: no   No notable events documented.   Last Vitals:  Vitals:   01/14/24 1153 01/14/24 1201  BP: (!) 102/56 114/64  Pulse: 71   Resp: 15 16  Temp:    SpO2: 100% 99%    Last Pain:  Vitals:   01/14/24 1210  TempSrc:   PainSc: 0-No pain                 Lenard Simmer

## 2024-01-14 NOTE — Transfer of Care (Signed)
Immediate Anesthesia Transfer of Care Note  Patient: Madison Harvey  Procedure(s) Performed: COLONOSCOPY WITH PROPOFOL  Patient Location: Endoscopy Unit  Anesthesia Type:General  Level of Consciousness: awake, drowsy, and patient cooperative  Airway & Oxygen Therapy: Patient Spontanous Breathing and Patient connected to face mask oxygen  Post-op Assessment: Report given to RN and Post -op Vital signs reviewed and stable  Post vital signs: Reviewed and stable  Last Vitals:  Vitals Value Taken Time  BP    Temp    Pulse 71 01/14/24 1152  Resp 15 01/14/24 1152  SpO2 100 % 01/14/24 1152  Vitals shown include unfiled device data.  Last Pain:  Vitals:   01/14/24 1058  TempSrc: Temporal  PainSc: 0-No pain         Complications: No notable events documented.

## 2024-01-14 NOTE — H&P (Signed)
Pre-Procedure H&P   Patient ID: Madison Harvey is a 63 y.o. female.  Gastroenterology Provider: Jaynie Collins, DO  Referring Provider: Fransico Setters, NP PCP: Dudley Major, FNP  Date: 01/14/2024  HPI Ms. Madison Harvey is a 63 y.o. female who presents today for Colonoscopy for Personal history of colon polyps .  Information garnered via televideo medical Spanish interpreter Madison Harvey)  Last underwent colonoscopy 2021 in [place] with adenomatous polyps noted.  She also had polyps in 2016.  Daily bowel meant without melena or hematochezia.  No family history of colon cancer or colon polyps. Patient initially scheduled for colonoscopy in November 2024, but left after IV placement  Hemoglobin 13 MCV 90.7 platelets 308,000 creatinine 0.5 INR 1.0   Past Medical History:  Diagnosis Date   Hyperlipidemia    Hypothyroidism    NASH (nonalcoholic steatohepatitis)    Sinus bradycardia     Past Surgical History:  Procedure Laterality Date   CHOLECYSTECTOMY     COLONOSCOPY WITH PROPOFOL N/A 05/17/2015   Procedure: COLONOSCOPY WITH PROPOFOL;  Surgeon: Scot Jun, MD;  Location: [facility] ENDOSCOPY;  Service: Endoscopy;  Laterality: N/A;   COLONOSCOPY WITH PROPOFOL N/A 10/06/2023   Procedure: COLONOSCOPY WITH PROPOFOL;  Surgeon: Toledo, Boykin Nearing, MD;  Location: [facility] ENDOSCOPY;  Service: Gastroenterology;  Laterality: N/A;    Family History No h/o GI disease or malignancy  Review of Systems  Constitutional:  Negative for activity change, appetite change, chills, diaphoresis, fatigue, fever and unexpected weight change.  HENT:  Negative for trouble swallowing and voice change.   Respiratory:  Negative for shortness of breath and wheezing.   Cardiovascular:  Negative for chest pain, palpitations and leg swelling.  Gastrointestinal:  Negative for abdominal distention, abdominal pain, anal bleeding, blood in stool, constipation, diarrhea, nausea, rectal pain and vomiting.   Musculoskeletal:  Negative for arthralgias and myalgias.  Skin:  Negative for color change and pallor.  Neurological:  Negative for dizziness, syncope and weakness.  Psychiatric/Behavioral:  Negative for confusion.   All other systems reviewed and are negative.    Medications No current facility-administered medications on file prior to encounter.   Current Outpatient Medications on File Prior to Encounter  Medication Sig Dispense Refill   Cholecalciferol (VITAMIN D3 PO) Take 400 Units by mouth daily.     gemfibrozil (LOPID) 600 MG tablet Take 1 tablet by mouth daily.     levothyroxine (SYNTHROID) 25 MCG tablet Take 1 tablet by mouth daily.     Pancrelipase, Lip-Prot-Amyl, 3000-9500 units CPEP Take 1 capsule by mouth daily. (Patient not taking: Reported on 09/29/2021)      Pertinent medications related to GI and procedure were reviewed by me with the patient prior to the procedure   Current Facility-Administered Medications:    0.9 %  sodium chloride infusion, , Intravenous, Continuous, Jaynie Collins, DO, Last Rate: 20 mL/hr at 01/14/24 1111, 500 mL at 01/14/24 1111  sodium chloride 500 mL (01/14/24 1111)       No Known Allergies Allergies were reviewed by me prior to the procedure  Objective   Body mass index is 25.26 kg/m. Vitals:   01/14/24 1058  BP: 115/64  Pulse: 63  Resp: 16  Temp: (!) 97 F (36.1 C)  TempSrc: Temporal  SpO2: 98%  Weight: 60.6 kg  Height: 5\' 1"  (1.549 m)     Physical Exam Vitals and nursing note reviewed.  Constitutional:      General: She is not in acute distress.  Appearance: Normal appearance. She is not ill-appearing, toxic-appearing or diaphoretic.  HENT:     Head: Normocephalic and atraumatic.     Nose: Nose normal.     Mouth/Throat:     Mouth: Mucous membranes are moist.     Pharynx: Oropharynx is clear.  Eyes:     General: No scleral icterus.    Extraocular Movements: Extraocular movements intact.   Cardiovascular:     Rate and Rhythm: Normal rate and regular rhythm.     Heart sounds: Normal heart sounds. No murmur heard.    No friction rub. No gallop.  Pulmonary:     Effort: Pulmonary effort is normal. No respiratory distress.     Breath sounds: Normal breath sounds. No wheezing, rhonchi or rales.  Abdominal:     General: Bowel sounds are normal. There is no distension.     Palpations: Abdomen is soft.     Tenderness: There is no abdominal tenderness. There is no guarding or rebound.  Musculoskeletal:     Cervical back: Neck supple.     Right lower leg: No edema.     Left lower leg: No edema.  Skin:    General: Skin is warm and dry.     Coloration: Skin is not jaundiced or pale.  Neurological:     General: No focal deficit present.     Mental Status: She is alert and oriented to person, place, and time. Mental status is at baseline.  Psychiatric:        Mood and Affect: Mood normal.        Behavior: Behavior normal.        Thought Content: Thought content normal.        Judgment: Judgment normal.      Assessment:  Ms. Madison Harvey is a 63 y.o. female  who presents today for Colonoscopy for Personal history of colon polyps .  Plan:  Colonoscopy with possible intervention today  Colonoscopy with possible biopsy, control of bleeding, polypectomy, and interventions as necessary has been discussed with the patient/patient representative. Informed consent was obtained from the patient/patient representative after explaining the indication, nature, and risks of the procedure including but not limited to death, bleeding, perforation, missed neoplasm/lesions, cardiorespiratory compromise, and reaction to medications. Opportunity for questions was given and appropriate answers were provided. Patient/patient representative has verbalized understanding is amenable to undergoing the procedure.   Jaynie Collins, DO  [facility]  Portions of the record  may have been created with voice recognition software. Occasional wrong-word or 'sound-a-like' substitutions may have occurred due to the inherent limitations of voice recognition software.  Read the chart carefully and recognize, using context, where substitutions may have occurred.

## 2024-01-17 ENCOUNTER — Encounter: Payer: Self-pay | Admitting: Gastroenterology

## 2024-02-11 ENCOUNTER — Other Ambulatory Visit: Payer: Self-pay | Admitting: Nurse Practitioner

## 2024-02-11 DIAGNOSIS — Z1231 Encounter for screening mammogram for malignant neoplasm of breast: Secondary | ICD-10-CM

## 2024-03-03 ENCOUNTER — Ambulatory Visit
Admission: RE | Admit: 2024-03-03 | Discharge: 2024-03-03 | Disposition: A | Discharge status: Home / Self Care | Source: Ambulatory Visit | Attending: Nurse Practitioner | Admitting: Nurse Practitioner

## 2024-03-03 DIAGNOSIS — Z1231 Encounter for screening mammogram for malignant neoplasm of breast: Secondary | ICD-10-CM | POA: Insufficient documentation

## 2024-05-11 ENCOUNTER — Emergency Department

## 2024-05-11 ENCOUNTER — Emergency Department
Admission: EM | Admit: 2024-05-11 | Discharge: 2024-05-11 | Disposition: A | Discharge status: Home / Self Care | Attending: Emergency Medicine | Admitting: Emergency Medicine

## 2024-05-11 ENCOUNTER — Other Ambulatory Visit: Payer: Self-pay

## 2024-05-11 DIAGNOSIS — R42 Dizziness and giddiness: Secondary | ICD-10-CM | POA: Insufficient documentation

## 2024-05-11 DIAGNOSIS — R269 Unspecified abnormalities of gait and mobility: Secondary | ICD-10-CM | POA: Insufficient documentation

## 2024-05-11 DIAGNOSIS — R110 Nausea: Secondary | ICD-10-CM | POA: Diagnosis not present

## 2024-05-11 LAB — COMPREHENSIVE METABOLIC PANEL WITH GFR
ALT: 17 U/L (ref 0–44)
AST: 23 U/L (ref 15–41)
Albumin: 3.6 g/dL (ref 3.5–5.0)
Alkaline Phosphatase: 93 U/L (ref 38–126)
Anion gap: 9 (ref 5–15)
BUN: 15 mg/dL (ref 8–23)
CO2: 26 mmol/L (ref 22–32)
Calcium: 8.8 mg/dL — ABNORMAL LOW (ref 8.9–10.3)
Chloride: 101 mmol/L (ref 98–111)
Creatinine, Ser: 0.57 mg/dL (ref 0.44–1.00)
GFR, Estimated: >60 mL/min (ref >60)
Glucose, Bld: 101 mg/dL — ABNORMAL HIGH (ref 70–99)
Potassium: 3.8 mmol/L (ref 3.5–5.1)
Sodium: 136 mmol/L (ref 135–145)
Total Bilirubin: 1.4 mg/dL — ABNORMAL HIGH (ref 0.0–1.2)
Total Protein: 7 g/dL (ref 6.5–8.1)

## 2024-05-11 LAB — CBC
HCT: 37.3 % (ref 36.0–46.0)
Hemoglobin: 12.3 g/dL (ref 12.0–15.0)
MCH: 29.6 pg (ref 26.0–34.0)
MCHC: 33 g/dL (ref 30.0–36.0)
MCV: 89.7 fL (ref 80.0–100.0)
Platelets: 248 10*3/uL (ref 150–400)
RBC: 4.16 MIL/uL (ref 3.87–5.11)
RDW: 13 % (ref 11.5–15.5)
WBC: 5.6 10*3/uL (ref 4.0–10.5)
nRBC: 0 % (ref 0.0–0.2)

## 2024-05-11 LAB — TROPONIN I (HIGH SENSITIVITY): Troponin I (High Sensitivity): 5 ng/L (ref <18)

## 2024-05-11 LAB — MAGNESIUM: Magnesium: 2.4 mg/dL (ref 1.7–2.4)

## 2024-05-11 MED ORDER — ACETAMINOPHEN 500 MG PO TABS
1000.0000 mg | ORAL_TABLET | Freq: Once | ORAL | Status: AC
  Administered 2024-05-11: 1000 mg via ORAL
  Filled 2024-05-11: qty 2

## 2024-05-11 MED ORDER — MECLIZINE HCL 25 MG PO TABS
25.0000 mg | ORAL_TABLET | Freq: Once | ORAL | Status: AC
  Administered 2024-05-11: 25 mg via ORAL
  Filled 2024-05-11: qty 1

## 2024-05-11 MED ORDER — MECLIZINE HCL 25 MG PO TABS: 25.0000 mg | ORAL_TABLET | Freq: Three times a day (TID) | ORAL | 0 refills | Status: AC | PRN

## 2024-05-11 MED ORDER — SODIUM CHLORIDE 0.9 % IV BOLUS
500.0000 mL | Freq: Once | INTRAVENOUS | Status: AC
  Administered 2024-05-11: 500 mL via INTRAVENOUS

## 2024-05-11 NOTE — ED Provider Notes (Signed)
 [facility] Provider Note    Event Date/Time   First MD Initiated Contact with Patient 05/11/24 0901     (approximate)   History   Dizziness and Nausea  Formal translating service utilized HPI  Madison Harvey is a 63 y.o. female past medical history significant for prediabetes, who presents to the emergency department with dizziness.  States that she was at work today where she developed room spinning dizziness.  Felt nauseous but no episodes of vomiting.  Worse when moving her head.  Feeling slightly off balance when she walked.  Does have some ongoing dizziness at rest.  A fall 1 month ago.  Not on anticoagulation.  Denies any chest pain or shortness of breath.  Denies any history of significant vertigo.  No recent travel.     Physical Exam   Triage Vital Signs: ED Triage Vitals  Encounter Vitals Group     BP      Girls Systolic BP Percentile      Girls Diastolic BP Percentile      Boys Systolic BP Percentile      Boys Diastolic BP Percentile      Pulse      Resp      Temp      Temp src      SpO2      Weight      Height      Head Circumference      Peak Flow      Pain Score      Pain Loc      Pain Education      Exclude from Growth Chart     Most recent vital signs: Vitals:   05/11/24 0930 05/11/24 1030  BP: 124/66 (!) 123/59  Pulse: (!) 55 (!) 53  Resp: 18 11  Temp: 98.5 F (36.9 C)   SpO2: 97% 100%    Physical Exam Constitutional:      Appearance: She is well-developed.  HENT:     Head: Atraumatic.   Eyes:     Conjunctiva/sclera: Conjunctivae normal.    Cardiovascular:     Rate and Rhythm: Regular rhythm.  Pulmonary:     Effort: No respiratory distress.  Abdominal:     General: There is no distension.     Tenderness: There is no abdominal tenderness.   Musculoskeletal:        General: Normal range of motion.     Cervical back: Normal range of motion.   Skin:    General: Skin is warm.   Neurological:      Mental Status: She is alert. Mental status is at baseline.     GCS: GCS eye subscore is 4. GCS verbal subscore is 5. GCS motor subscore is 6.     Cranial Nerves: Cranial nerves 2-12 are intact.     Sensory: Sensation is intact.     Motor: Motor function is intact.     Coordination: Coordination is intact.     Gait: Gait abnormal.     Comments: Mild gait and balance.  No no dysmetria with finger-to-nose.  No nystagmus.  Extraocular movements are intact.  Negative Dix-Hallpike maneuver    IMPRESSION / MDM / ASSESSMENT AND PLAN / ED COURSE  I reviewed the triage vital signs and the nursing notes.  Differential diagnosis including peripheral vertigo, central vertigo, complex migraine, intracranial hemorrhage, CVA, electrolyte abnormality, ACS  Plan for IV fluid, Tylenol , meclizine  EKG  I, Viviano Ground, the attending physician,  personally viewed and interpreted this ECG.   Rate: Normal  Rhythm: Normal sinus  Axis: Normal  Intervals: Normal  ST&T Change: None  No tachycardic or bradycardic dysrhythmias while on cardiac telemetry.  RADIOLOGY I independently reviewed imaging, my interpretation of imaging: CT scan head -no findings of intracranial hemorrhage or infarction.  LABS (all labs ordered are listed, but only abnormal results are displayed) Labs interpreted as -    Labs Reviewed  COMPREHENSIVE METABOLIC PANEL WITH GFR - Abnormal; Notable for the following components:      Result Value   Glucose, Bld 101 (*)    Calcium 8.8 (*)    Total Bilirubin 1.4 (*)    All other components within normal limits  CBC  MAGNESIUM  TROPONIN I (HIGH SENSITIVITY)  TROPONIN I (HIGH SENSITIVITY)     MDM  Lab work overall reassuring with no significant leukocytosis or anemia.  Creatinine appears to be at her baseline.  No significant electrolyte abnormality.  Troponin is negative at 5 have low suspicion for ACS, no chest pain or shortness of breath at this time do not feel that serial  troponins are necessary.  Magnesium within normal limits.   Clinical Course as of 05/11/24 1452  Thu May 11, 2024  1237 On reevaluation after meclizine patient states that she is feeling better but has some ongoing mild dizziness at rest.  When ambulating the patient she overall did well however did have some mild gait instability which she states which is not quite her baseline.  Will order an MRI to further stratify for central cause of her vertigo. [SM]  1451 MRI read as no acute findings.  Patient more likely with peripheral vertigo.  Will give a prescription for meclizine.  Able to ambulate in the emergency department.  Discussed close follow-up with primary care physician.  Discussed return for any return of symptoms. [SM]    Clinical Course User Index [SM] Viviano Ground, MD     PROCEDURES:  Critical Care performed: No  Procedures  Patient's presentation is most consistent with acute presentation with potential threat to life or bodily function.   MEDICATIONS ORDERED IN ED: Medications  sodium chloride  0.9 % bolus 500 mL (0 mLs Intravenous Stopped 05/11/24 1227)  meclizine (ANTIVERT) tablet 25 mg (25 mg Oral Given 05/11/24 1000)  acetaminophen  (TYLENOL ) tablet 1,000 mg (1,000 mg Oral Given 05/11/24 1000)    FINAL CLINICAL IMPRESSION(S) / ED DIAGNOSES   Final diagnoses:  Dizziness     Rx / DC Orders   ED Discharge Orders          Ordered    meclizine (ANTIVERT) 25 MG tablet  3 times daily PRN        05/11/24 1452             Note:  This document was prepared using Dragon voice recognition software and may include unintentional dictation errors.   Viviano Ground, MD 05/11/24 1453

## 2024-05-11 NOTE — ED Triage Notes (Signed)
 Pt to ED from home with dizziness at rest and nausea since 0700 this morning. A&O x4 negative for orthostatic hypotension for EMS.

## 2024-05-11 NOTE — ED Notes (Signed)
 Requested face to face interpreter

## 2024-05-11 NOTE — ED Notes (Signed)
 [facility] called and requested Dr. To use the stick interpreter

## 2024-11-17 ENCOUNTER — Other Ambulatory Visit: Payer: Self-pay | Admitting: Gastroenterology

## 2024-11-17 DIAGNOSIS — K760 Fatty (change of) liver, not elsewhere classified: Secondary | ICD-10-CM

## 2024-11-28 ENCOUNTER — Ambulatory Visit
Admission: RE | Admit: 2024-11-28 | Discharge: 2024-11-28 | Disposition: A | Discharge status: Home / Self Care | Source: Ambulatory Visit | Attending: Gastroenterology | Admitting: Gastroenterology

## 2024-11-28 DIAGNOSIS — K760 Fatty (change of) liver, not elsewhere classified: Secondary | ICD-10-CM | POA: Insufficient documentation
